# Patient Record
Sex: Female | Born: 1970 | Race: White | Hispanic: No | Marital: Married | Smoking: Never smoker
Health system: Southern US, Community
[De-identification: ages and names within clinical notes are randomized; demographics above are authoritative.]

## PROBLEM LIST (undated history)

## (undated) DIAGNOSIS — J302 Other seasonal allergic rhinitis: Secondary | ICD-10-CM

## (undated) DIAGNOSIS — J45909 Unspecified asthma, uncomplicated: Secondary | ICD-10-CM

## (undated) DIAGNOSIS — I639 Cerebral infarction, unspecified: Secondary | ICD-10-CM

## (undated) HISTORY — DX: Cerebral infarction, unspecified: I63.9

## (undated) HISTORY — DX: Unspecified asthma, uncomplicated: J45.909

## (undated) HISTORY — DX: Other seasonal allergic rhinitis: J30.2

---
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
## 2011-03-19 HISTORY — PX: ANTERIOR CRUCIATE LIGAMENT REPAIR: SHX115

## 2014-12-26 ENCOUNTER — Other Ambulatory Visit: Payer: Self-pay

## 2014-12-26 ENCOUNTER — Other Ambulatory Visit (HOSPITAL_COMMUNITY)
Admission: RE | Admit: 2014-12-26 | Discharge: 2014-12-26 | Disposition: A | Discharge status: Home / Self Care | Payer: 59 | Source: Ambulatory Visit | Attending: Obstetrics and Gynecology | Admitting: Obstetrics and Gynecology

## 2014-12-26 DIAGNOSIS — Z1151 Encounter for screening for human papillomavirus (HPV): Secondary | ICD-10-CM | POA: Insufficient documentation

## 2014-12-26 DIAGNOSIS — Z01419 Encounter for gynecological examination (general) (routine) without abnormal findings: Secondary | ICD-10-CM | POA: Diagnosis present

## 2014-12-26 DIAGNOSIS — Z1231 Encounter for screening mammogram for malignant neoplasm of breast: Secondary | ICD-10-CM

## 2015-01-06 ENCOUNTER — Ambulatory Visit: Payer: Self-pay

## 2015-01-23 ENCOUNTER — Ambulatory Visit
Admission: RE | Admit: 2015-01-23 | Discharge: 2015-01-23 | Disposition: A | Discharge status: Home / Self Care | Payer: 59 | Source: Ambulatory Visit

## 2015-01-23 DIAGNOSIS — Z1231 Encounter for screening mammogram for malignant neoplasm of breast: Secondary | ICD-10-CM

## 2015-12-28 ENCOUNTER — Other Ambulatory Visit: Payer: Self-pay | Admitting: Obstetrics and Gynecology

## 2015-12-28 DIAGNOSIS — Z1231 Encounter for screening mammogram for malignant neoplasm of breast: Secondary | ICD-10-CM

## 2016-01-01 ENCOUNTER — Encounter: Payer: Self-pay | Admitting: Family Medicine

## 2016-01-01 ENCOUNTER — Ambulatory Visit (INDEPENDENT_AMBULATORY_CARE_PROVIDER_SITE_OTHER): Payer: 59 | Admitting: Family Medicine

## 2016-01-01 VITALS — BP 112/82 | HR 77 | Temp 98.3°F | Wt 151.8 lb

## 2016-01-01 DIAGNOSIS — L729 Follicular cyst of the skin and subcutaneous tissue, unspecified: Secondary | ICD-10-CM

## 2016-01-01 DIAGNOSIS — Z131 Encounter for screening for diabetes mellitus: Secondary | ICD-10-CM

## 2016-01-01 DIAGNOSIS — Z1329 Encounter for screening for other suspected endocrine disorder: Secondary | ICD-10-CM

## 2016-01-01 DIAGNOSIS — Z130 Encounter for screening for diseases of the blood and blood-forming organs and certain disorders involving the immune mechanism: Secondary | ICD-10-CM | POA: Diagnosis not present

## 2016-01-01 DIAGNOSIS — F329 Major depressive disorder, single episode, unspecified: Secondary | ICD-10-CM | POA: Diagnosis not present

## 2016-01-01 DIAGNOSIS — Z13220 Encounter for screening for lipoid disorders: Secondary | ICD-10-CM

## 2016-01-01 LAB — CBC
HEMATOCRIT: 42.7 % (ref 36.0–46.0)
Hemoglobin: 14.2 g/dL (ref 12.0–15.0)
MCHC: 33.3 g/dL (ref 30.0–36.0)
MCV: 90.7 fl (ref 78.0–100.0)
Platelets: 246 10*3/uL (ref 150.0–400.0)
RBC: 4.71 Mil/uL (ref 3.87–5.11)
RDW: 13.2 % (ref 11.5–15.5)
WBC: 4.8 10*3/uL (ref 4.0–10.5)

## 2016-01-01 LAB — LIPID PANEL
CHOL/HDL RATIO: 2
Cholesterol: 187 mg/dL (ref 0–200)
HDL: 80 mg/dL (ref >39.00)
LDL Cholesterol: 92 mg/dL (ref 0–99)
NONHDL: 107.46
Triglycerides: 77 mg/dL (ref 0.0–149.0)
VLDL: 15.4 mg/dL (ref 0.0–40.0)

## 2016-01-01 LAB — COMPREHENSIVE METABOLIC PANEL
ALBUMIN: 4.7 g/dL (ref 3.5–5.2)
ALK PHOS: 32 U/L — AB (ref 39–117)
ALT: 11 U/L (ref 0–35)
AST: 18 U/L (ref 0–37)
BUN: 12 mg/dL (ref 6–23)
CHLORIDE: 102 meq/L (ref 96–112)
CO2: 29 mEq/L (ref 19–32)
CREATININE: 0.92 mg/dL (ref 0.40–1.20)
Calcium: 9.4 mg/dL (ref 8.4–10.5)
GFR: 70.64 mL/min (ref >60.00)
GLUCOSE: 81 mg/dL (ref 70–99)
POTASSIUM: 4.4 meq/L (ref 3.5–5.1)
SODIUM: 137 meq/L (ref 135–145)
TOTAL PROTEIN: 7.7 g/dL (ref 6.0–8.3)
Total Bilirubin: 0.5 mg/dL (ref 0.2–1.2)

## 2016-01-01 LAB — HEMOGLOBIN A1C: HEMOGLOBIN A1C: 5.3 % (ref 4.6–6.5)

## 2016-01-01 LAB — TSH: TSH: 2.38 u[IU]/mL (ref 0.35–4.50)

## 2016-01-01 MED ORDER — FLUOXETINE HCL 20 MG PO TABS: 20.0000 mg | ORAL_TABLET | Freq: Every day | ORAL | 5 refills | Status: DC

## 2016-01-01 MED ORDER — CEPHALEXIN 500 MG PO CAPS: 500.0000 mg | ORAL_CAPSULE | Freq: Two times a day (BID) | ORAL | 0 refills | Status: DC

## 2016-01-01 NOTE — Patient Instructions (Signed)
It was very nice to meet you today!  We will check your blood count, thyroid, cholesterol today and I will be in touch with your results Try the antibiotic for one week to see if we can get rid of the cyst in your nose.  Please let me know if this persists  We will start you on prozac 20 mg for depression.   Take 1 pill, then increase to 2 pills after 2-3 weeks.   Please come and see me in about 2 months to check on your progress- sooner if you are getting worse or do not notice any improvement.

## 2016-01-01 NOTE — Progress Notes (Signed)
[facility] [address], [address] [place], [place] [address] [phone] [phone]  Date:  01/01/2016   Name:  Laveda AbbeMaria D Lennartz   DOB:  08/13/1970   MRN:  [id number]  PCP:  Abbe AmsterdamOPLAND,JESSICA, MD    Chief Complaint: Establish Care (Pt here to est care. Would like to discuss )   History of Present Illness:  Laveda AbbeMaria D Grewell is a 45 y.o. very pleasant female patient who presents with the following:  She is here today as a new patient.  She has moved around a good bit, has been in this area for 3 years or so She has generally been very healthy, but wanted to come and see  She has 3 boys- ages 6712, 810 and 398.  She does not work outside the home right now.  She also helps to care for her mother who is ill with Parkinson's disease and a stroke. She is also very involved with the PTA.    She does have mild asthma- she uses her inhaler as needed.,  She needs it more in certain seasons.  She may need it for EIA more in the colder months.    She has otherwise generally in good health She sees Dr. Richardson Doppole for her OBG care mammo is scheduled for   Last tetanus in 2014 while she was living in [place]  She feels like she began to have some depression sx when they moved, she became pregnant and her mother got Parkinson's disease all at the same time.  Then in 2013 they had to move again, and then again a couple of years ago.  She thinks that this stress and her mom's stroke over the summer.   Her father is 43 yo, they are still married and live together  She tries to handle her sadness by exercise, spending time with friends, shopping  She has never been a "big medicine person" but thinks that she might need medication for depression  She can feel overwhelmed, and sometimes does not feel like getting out.  She will sometimes be tearful. She is sleeping pretty well but can wake up feeling anxious in the night.   Appetite is ok.    No SI.  She has never  been treated for depression.  Looking back she thinks that she has been depressed on and off for years.    Her LMP was 10/1//17  She declines a flu shot  There are no active problems to display for this patient.   No past medical history on file.  No past surgical history on file.  Social History  Substance Use Topics  . Smoking status: Not on file  . Smokeless tobacco: Not on file  . Alcohol use Not on file    No family history on file.  Allergies not on file  Medication list has been reviewed and updated.  No current outpatient prescriptions on file prior to visit.   No current facility-administered medications on file prior to visit.     Review of Systems:  As per HPI- otherwise negative. No fever, chills, nausea, vomiting, CP or SOB  Physical Examination: Vitals:   01/01/16 0949  BP: 112/82  Pulse: 77  Temp: 98.3 F (36.8 C)   Vitals:   01/01/16 0949  Weight: 151 lb 12.8 oz (68.9 kg)   There is no height or weight on file to calculate BMI. Ideal Body Weight:    GEN: WDWN, NAD, Non-toxic,  A & O x 3, looks well, slightly tearful when discussing her mom's illness HEENT: Atraumatic, Normocephalic. Neck supple. No masses, No LAD.  Bilateral TM wnl, oropharynx normal.  PEERL,EOMI.   She has noted a tender "cyst" in her right nostril that has come and gone for a few months. It will sometimes swell up and be tender like a pimple.   Ears and Nose: No external deformity. CV: RRR, No M/G/R. No JVD. No thrill. No extra heart sounds. PULM: CTA B, no wheezes, crackles, rhonchi. No retractions. No resp. distress. No accessory muscle use. ABD: S, NT, ND EXTR: No c/c/e NEURO Normal gait.  PSYCH: Normally interactive. Conversant. Not depressed or anxious appearing.  Calm demeanor.    Assessment and Plan: Reactive depression - Plan: FLUoxetine (PROZAC) 20 MG tablet  Screening for hyperlipidemia - Plan: Lipid panel  Screening for deficiency anemia - Plan:  CBC  Screening for diabetes mellitus - Plan: Comprehensive metabolic panel, Hemoglobin A1C  Screening for thyroid disorder - Plan: TSH  Subcutaneous cyst - Plan: cephALEXin (KEFLEX) 500 MG capsule  Start on prozac 20 mg for depression, increase to 40 mg after 2-3 weeks.  Warned about serotonin syndrome and possible SI in pt's on SSRI treatment.  Plan to recheck here in 2 months Will try a week of keflex for the "cyst" in her nostril  Signed Abbe Amsterdam, MD

## 2016-01-01 NOTE — Progress Notes (Signed)
Pre visit review using our clinic review tool, if applicable. No additional management support is needed unless otherwise documented below in the visit note. 

## 2016-01-17 ENCOUNTER — Encounter: Payer: Self-pay | Admitting: Family Medicine

## 2016-01-17 MED ORDER — ALBUTEROL SULFATE HFA 108 (90 BASE) MCG/ACT IN AERS: 1.0000 | INHALATION_SPRAY | Freq: Four times a day (QID) | RESPIRATORY_TRACT | 6 refills | Status: DC | PRN

## 2016-01-24 ENCOUNTER — Ambulatory Visit
Admission: RE | Admit: 2016-01-24 | Discharge: 2016-01-24 | Disposition: A | Discharge status: Home / Self Care | Payer: 59 | Source: Ambulatory Visit | Attending: Obstetrics and Gynecology | Admitting: Obstetrics and Gynecology

## 2016-01-24 DIAGNOSIS — Z1231 Encounter for screening mammogram for malignant neoplasm of breast: Secondary | ICD-10-CM

## 2016-03-04 ENCOUNTER — Ambulatory Visit: Payer: 59 | Admitting: Family Medicine

## 2016-03-06 ENCOUNTER — Observation Stay (HOSPITAL_COMMUNITY): Payer: 59

## 2016-03-06 ENCOUNTER — Ambulatory Visit: Payer: Self-pay | Admitting: Family Medicine

## 2016-03-06 ENCOUNTER — Telehealth: Payer: Self-pay | Admitting: Family Medicine

## 2016-03-06 ENCOUNTER — Ambulatory Visit: Payer: Self-pay | Admitting: Family

## 2016-03-06 ENCOUNTER — Encounter (HOSPITAL_COMMUNITY): Payer: Self-pay | Admitting: Vascular Surgery

## 2016-03-06 ENCOUNTER — Ambulatory Visit (INDEPENDENT_AMBULATORY_CARE_PROVIDER_SITE_OTHER): Payer: 59 | Admitting: Family Medicine

## 2016-03-06 ENCOUNTER — Observation Stay (HOSPITAL_COMMUNITY)
Admission: EM | Admit: 2016-03-06 | Discharge: 2016-03-08 | Disposition: A | Discharge status: Home / Self Care | Payer: 59 | Attending: Internal Medicine | Admitting: Internal Medicine

## 2016-03-06 VITALS — BP 120/84 | HR 53 | Temp 98.0°F | Ht 67.0 in | Wt 149.0 lb

## 2016-03-06 DIAGNOSIS — I7774 Dissection of vertebral artery: Secondary | ICD-10-CM | POA: Diagnosis not present

## 2016-03-06 DIAGNOSIS — G459 Transient cerebral ischemic attack, unspecified: Secondary | ICD-10-CM | POA: Diagnosis not present

## 2016-03-06 DIAGNOSIS — I7771 Dissection of carotid artery: Secondary | ICD-10-CM | POA: Diagnosis present

## 2016-03-06 DIAGNOSIS — Z823 Family history of stroke: Secondary | ICD-10-CM | POA: Insufficient documentation

## 2016-03-06 DIAGNOSIS — R2990 Unspecified symptoms and signs involving the nervous system: Secondary | ICD-10-CM

## 2016-03-06 DIAGNOSIS — Q2112 Patent foramen ovale: Secondary | ICD-10-CM

## 2016-03-06 DIAGNOSIS — Q211 Atrial septal defect: Secondary | ICD-10-CM | POA: Diagnosis not present

## 2016-03-06 DIAGNOSIS — J4520 Mild intermittent asthma, uncomplicated: Secondary | ICD-10-CM

## 2016-03-06 DIAGNOSIS — J45909 Unspecified asthma, uncomplicated: Secondary | ICD-10-CM | POA: Diagnosis not present

## 2016-03-06 LAB — URINALYSIS, ROUTINE W REFLEX MICROSCOPIC
Bilirubin Urine: NEGATIVE
GLUCOSE, UA: NEGATIVE mg/dL
Hgb urine dipstick: NEGATIVE
Ketones, ur: 5 mg/dL — AB
Nitrite: NEGATIVE
PH: 6 (ref 5.0–8.0)
Protein, ur: NEGATIVE mg/dL
Specific Gravity, Urine: 1.002 — ABNORMAL LOW (ref 1.005–1.030)

## 2016-03-06 LAB — DIFFERENTIAL
Basophils Absolute: 0 10*3/uL (ref 0.0–0.1)
Basophils Relative: 0 %
EOS ABS: 0.2 10*3/uL (ref 0.0–0.7)
EOS PCT: 3 %
LYMPHS PCT: 32 %
Lymphs Abs: 1.9 10*3/uL (ref 0.7–4.0)
MONO ABS: 0.5 10*3/uL (ref 0.1–1.0)
Monocytes Relative: 9 %
Neutro Abs: 3.3 10*3/uL (ref 1.7–7.7)
Neutrophils Relative %: 56 %

## 2016-03-06 LAB — CBC
HCT: 41.4 % (ref 36.0–46.0)
Hemoglobin: 13.5 g/dL (ref 12.0–15.0)
MCH: 29.9 pg (ref 26.0–34.0)
MCHC: 32.6 g/dL (ref 30.0–36.0)
MCV: 91.6 fL (ref 78.0–100.0)
PLATELETS: 221 10*3/uL (ref 150–400)
RBC: 4.52 MIL/uL (ref 3.87–5.11)
RDW: 13.3 % (ref 11.5–15.5)
WBC: 5.9 10*3/uL (ref 4.0–10.5)

## 2016-03-06 LAB — COMPREHENSIVE METABOLIC PANEL
ALK PHOS: 36 U/L — AB (ref 38–126)
ALT: 13 U/L — ABNORMAL LOW (ref 14–54)
ANION GAP: 8 (ref 5–15)
AST: 21 U/L (ref 15–41)
Albumin: 4.3 g/dL (ref 3.5–5.0)
BILIRUBIN TOTAL: 0.6 mg/dL (ref 0.3–1.2)
BUN: 11 mg/dL (ref 6–20)
CALCIUM: 9.3 mg/dL (ref 8.9–10.3)
CO2: 25 mmol/L (ref 22–32)
Chloride: 104 mmol/L (ref 101–111)
Creatinine, Ser: 0.86 mg/dL (ref 0.44–1.00)
GFR calc non Af Amer: >60 mL/min (ref >60)
Glucose, Bld: 91 mg/dL (ref 65–99)
Potassium: 3.9 mmol/L (ref 3.5–5.1)
SODIUM: 137 mmol/L (ref 135–145)
TOTAL PROTEIN: 7.1 g/dL (ref 6.5–8.1)

## 2016-03-06 LAB — TROPONIN I: Troponin I: <0.03 ng/mL (ref <0.03)

## 2016-03-06 LAB — PROTIME-INR
INR: 0.94
PROTHROMBIN TIME: 12.6 s (ref 11.4–15.2)

## 2016-03-06 LAB — APTT: aPTT: 33 seconds (ref 24–36)

## 2016-03-06 LAB — I-STAT TROPONIN, ED: Troponin i, poc: 0 ng/mL (ref 0.00–0.08)

## 2016-03-06 MED ORDER — ACETAMINOPHEN 160 MG/5ML PO SOLN: 650.0000 mg | ORAL | Status: DC | PRN

## 2016-03-06 MED ORDER — ALBUTEROL SULFATE (2.5 MG/3ML) 0.083% IN NEBU: 3.0000 mL | INHALATION_SOLUTION | Freq: Four times a day (QID) | RESPIRATORY_TRACT | Status: DC | PRN

## 2016-03-06 MED ORDER — SENNOSIDES-DOCUSATE SODIUM 8.6-50 MG PO TABS: 1.0000 | ORAL_TABLET | Freq: Every evening | ORAL | Status: DC | PRN

## 2016-03-06 MED ORDER — SODIUM CHLORIDE 0.9 % IV SOLN
INTRAVENOUS | Status: DC
  Administered 2016-03-07: 1 mL via INTRAVENOUS

## 2016-03-06 MED ORDER — ACETAMINOPHEN 650 MG RE SUPP: 650.0000 mg | RECTAL | Status: DC | PRN

## 2016-03-06 MED ORDER — IOPAMIDOL (ISOVUE-370) INJECTION 76%
INTRAVENOUS | Status: AC
  Administered 2016-03-06: 50 mL
  Filled 2016-03-06: qty 50

## 2016-03-06 MED ORDER — ACETAMINOPHEN 325 MG PO TABS
650.0000 mg | ORAL_TABLET | ORAL | Status: DC | PRN
  Administered 2016-03-07: 650 mg via ORAL
  Filled 2016-03-06: qty 2

## 2016-03-06 MED ORDER — HEPARIN (PORCINE) IN NACL 100-0.45 UNIT/ML-% IJ SOLN
900.0000 [IU]/h | INTRAMUSCULAR | Status: DC
  Administered 2016-03-07: 800 [IU]/h via INTRAVENOUS
  Filled 2016-03-06 (×2): qty 250

## 2016-03-06 MED ORDER — STROKE: EARLY STAGES OF RECOVERY BOOK
Freq: Once | Status: AC
  Administered 2016-03-07: 01:00:00

## 2016-03-06 NOTE — Telephone Encounter (Signed)
Caller name: Byrd HesselbachMaria Relationship to patient: self Can be reached: [phone]  Reason for call: pt called in stating yesterday she had slurred speech lasting only a few seconds to a minute. She had left arm numbness that lasted 1/2 the day. Transferred to Sarah with [facility].

## 2016-03-06 NOTE — Progress Notes (Signed)
ANTICOAGULATION CONSULT NOTE - Initial Consult  Pharmacy Consult for Heparin Indication: carotid dissection  No Known Allergies  Patient Measurements: Height: 5\' 7"  (170.2 cm) Weight: 149 lb (67.6 kg) IBW/kg (Calculated) : 61.6 Heparin Dosing Weight: 67.6 kg  Vital Signs: Temp: 97.7 F (36.5 C) (12/20 1656) Temp Source: Oral (12/20 1656) BP: 108/67 (12/20 2030) Pulse Rate: 66 (12/20 2030)  Labs:  Recent Labs  03/06/16 1503  HGB 13.5  HCT 41.4  PLT 221  APTT 33  LABPROT 12.6  INR 0.94  CREATININE 0.86    Estimated Creatinine Clearance: 82 mL/min (by C-G formula based on SCr of 0.86 mg/dL).   Medical History: Past Medical History:  Diagnosis Date  . Asthma   . Seasonal allergies     Medications:   (Not in a hospital admission) Scheduled:  Infusions:   Assessment: 45yo female history of asthma presents with aphasia and left arm weakness. Pharmacy is consulted to dose heparin for carotid dissection with no bolus and low goal.   Goal of Therapy:  Heparin level 0.3-0.5 units/ml Monitor platelets by anticoagulation protocol: Yes   Plan:  Start heparin infusion at 800 units/hr Check anti-Xa level in 8 hours and daily while on heparin Continue to monitor H&H and platelets  Arlean Hoppingorey M. Newman PiesBall, PharmD, BCPS Clinical Pharmacist Pager [phone] 03/06/2016,8:56 PM

## 2016-03-06 NOTE — Telephone Encounter (Signed)
Patient Name: Janice EllisMARIA Myers  DOB: 07/06/1970    Initial Comment Caller states she had an episode yesterday where her left arm went weak and she had a few seconds of slurred speech. The symptoms got better as the day went on, but she isn't having symptoms now.    Nurse Assessment  Nurse: Sherilyn CooterHenry, RN, Thurmond ButtsWade Date/Time Lamount Cohen(Eastern Time): 03/06/2016 8:32:53 AM  Confirm and document reason for call. If symptomatic, describe symptoms. ---Caller states that she had weakness in her left arm yesterday which lasted about 2 hours. When she held her arms up in front of her, her left wrist would drop. She had some slurring of her speech, but it only last a few seconds. She denies symptoms at this time. She has a tightness in her left neck which she states feels like a pinched nerve. She denies pain. She states that she feels she needs a good massage.  Does the patient have any new or worsening symptoms? ---Yes  Will a triage be completed? ---Yes  Related visit to physician within the last 2 weeks? ---No  Does the PT have any chronic conditions? (i.e. diabetes, asthma, etc.) ---Yes  List chronic conditions. ---Slight Asthma/Allergies  Is the patient pregnant or possibly pregnant? (Ask all females between the ages of 1112-55) ---No  Is this a behavioral health or substance abuse call? ---No     Guidelines    Guideline Title Affirmed Question Affirmed Notes  Neurologic Deficit [1] Weakness (i.e., paralysis, loss of muscle strength) of the face, arm / hand, or leg / foot on one side of the body AND [2] sudden onset AND [3] brief (now gone)    Final Disposition User   Go to ED Now (or PCP triage) Sherilyn CooterHenry, RN, Thurmond ButtsWade    Comments  No appointments available with Dr. Dallas Schimkeopeland. Only one with one available close to the recommended time frame was NP Sandford CrazeMelissa O'Sullivan at 1:15pm. I checked [facility] for an earlier one and none were available. She took the one with NP Peggyann Juba'Sullivan.   Referrals  REFERRED TO PCP OFFICE    Disagree/Comply: Comply

## 2016-03-06 NOTE — ED Provider Notes (Signed)
[facility]EM[facility]ENCY DEPT Provider Note   CSN: [id number] Arrival date & time: 03/06/16  1455     History   Chief Complaint Chief Complaint  Patient presents with  . Aphasia    HPI Maheen Cwikla is a 45 y.o. female.  Patient is a 45 year old female with history of asthma but no other medical problems who presents for evaluation of possible TIA. Initially morning around 7:30 patient had 1 minute of slurred speech followed by 5 hours of left arm weakness. Symptoms completely resolved by noon yesterday. Patient has not had any further symptoms since.    The history is provided by the patient.  Neurologic Problem  This is a new problem. The current episode started yesterday. The problem has been resolved. Pertinent negatives include no chest pain, no abdominal pain, no headaches and no shortness of breath.    Past Medical History:  Diagnosis Date  . Asthma   . Seasonal allergies     Patient Active Problem List   Diagnosis Date Noted  . TIA (transient ischemic attack) 03/06/2016  . Asthma 03/06/2016  . Reactive depression 01/01/2016    Past Surgical History:  Procedure Laterality Date  . ANTERIOR CRUCIATE LIGAMENT REPAIR  2013  . KELOID EXCISION  1992    OB History    No data available       Home Medications    Prior to Admission medications   Medication Sig Start Date End Date Taking? Authorizing Provider  albuterol (PROVENTIL HFA;VENTOLIN HFA) 108 (90 Base) MCG/ACT inhaler Inhale 1 puff into the lungs every 6 (six) hours as needed for wheezing or shortness of breath. 01/17/16  Yes Pearline Cables, MD    Family History Family History  Problem Relation Age of Onset  . Stroke Mother   . Parkinson's disease Mother   . Hyperlipidemia Father   . Alzheimer's disease Maternal Grandmother   . Prostate cancer Maternal Grandfather   . Heart disease Paternal Grandfather   . Epilepsy Other     Uncle    Social History Social History  Substance Use Topics  .  Smoking status: Never Smoker  . Smokeless tobacco: Never Used  . Alcohol use Yes     Comment: Seldom     Allergies   Patient has no known allergies.   Review of Systems Review of Systems  Constitutional: Negative for chills and fever.  HENT: Negative for ear pain.   Eyes: Negative for pain and visual disturbance.  Respiratory: Negative for cough and shortness of breath.   Cardiovascular: Negative for chest pain and palpitations.  Gastrointestinal: Negative for abdominal pain, nausea and vomiting.  Genitourinary: Negative for dysuria and hematuria.  Musculoskeletal: Negative for neck pain.  Skin: Negative for rash.  Neurological: Positive for speech difficulty and weakness. Negative for dizziness, syncope, facial asymmetry, numbness and headaches.  Psychiatric/Behavioral: Negative for confusion.  All other systems reviewed and are negative.    Physical Exam Updated Vital Signs BP 128/84   Pulse (!) 55   Temp 97.7 F (36.5 C) (Oral)   Resp 16   Ht 5\' 7"  (1.702 m)   Wt 67.6 kg   LMP 02/16/2016   SpO2 98%   BMI 23.34 kg/m   Physical Exam  Constitutional: She appears well-developed and well-nourished. No distress.  HENT:  Head: Normocephalic and atraumatic.  Mouth/Throat: Oropharynx is clear and moist.  Eyes: Conjunctivae and EOM are normal. Pupils are equal, round, and reactive to light.  Neck: Neck supple.  Cardiovascular: Normal rate and  regular rhythm.   No murmur heard. Pulmonary/Chest: Effort normal and breath sounds normal. No respiratory distress.  Abdominal: Soft. There is no tenderness.  Musculoskeletal: She exhibits no edema.  Neurological: She is alert. She has normal strength. She is not disoriented. No cranial nerve deficit or sensory deficit. She displays a negative Romberg sign. GCS eye subscore is 4. GCS verbal subscore is 5. GCS motor subscore is 6.  5/5 strength all 4 ext  Skin: Skin is warm and dry.  Psychiatric: She has a normal mood and  affect.  Nursing note and vitals reviewed.    ED Treatments / Results  Labs (all labs ordered are listed, but only abnormal results are displayed) Labs Reviewed  COMPREHENSIVE METABOLIC PANEL - Abnormal; Notable for the following:       Result Value   ALT 13 (*)    Alkaline Phosphatase 36 (*)    All other components within normal limits  URINALYSIS, ROUTINE W REFLEX MICROSCOPIC - Abnormal; Notable for the following:    Color, Urine STRAW (*)    Specific Gravity, Urine 1.002 (*)    Ketones, ur 5 (*)    Leukocytes, UA MODERATE (*)    Bacteria, UA RARE (*)    Squamous Epithelial / LPF 0-5 (*)    All other components within normal limits  PROTIME-INR  APTT  CBC  DIFFERENTIAL  I-STAT TROPOININ, ED    EKG  EKG Interpretation None       Radiology Ct Angio Head W Or Wo Contrast  Result Date: 03/06/2016 CLINICAL DATA:  Slurred speech for 1 minutes, LEFT arm weakness for 2 hours. Assess transient ischemic attack. EXAM: CT ANGIOGRAPHY HEAD AND NECK TECHNIQUE: Multidetector CT imaging of the head and neck was performed using the standard protocol during bolus administration of intravenous contrast. Multiplanar CT image reconstructions and MIPs were obtained to evaluate the vascular anatomy. Carotid stenosis measurements (when applicable) are obtained utilizing NASCET criteria, using the distal internal carotid diameter as the denominator. CONTRAST:  50 cc Isovue 370 COMPARISON:  None. FINDINGS: CT HEAD FINDINGS BRAIN: The ventricles and sulci are normal. No intraparenchymal hemorrhage, mass effect nor midline shift. No acute large vascular territory infarcts. No abnormal extra-axial fluid collections. Basal cisterns are patent. VASCULAR: Unremarkable. SKULL/SOFT TISSUES: No skull fracture. No significant soft tissue swelling. ORBITS/SINUSES: The included ocular globes and orbital contents are normal.The mastoid aircells and included paranasal sinuses are well-aerated. OTHER: None. CTA  NECK AORTIC ARCH: Normal appearance of the thoracic arch, normal branch pattern. The origins of the innominate, left Common carotid artery and subclavian artery are widely patent. RIGHT CAROTID SYSTEM: Common carotid artery is widely patent, coursing in a straight line fashion. Normal appearance of the carotid bifurcation without hemodynamically significant stenosis by NASCET criteria. Normal appearance of the included internal carotid artery. LEFT CAROTID SYSTEM: Common carotid artery is widely patent, coursing in a straight line fashion. Normal appearance of the carotid bifurcation without hemodynamically significant stenosis by NASCET criteria. Normal appearance of the included internal carotid artery. VERTEBRAL ARTERIES:Codominant vertebral artery's. 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 No intimal hematoma. No flow limiting stenosis. SKELETON: No acute osseous process though bone windows have not been submitted. Mild degenerative change of the cervical spine without significant neural foraminal narrowing. OTHER NECK: Soft tissues of the neck are non-acute though, not tailored for evaluation. Effaced LEFT piriform sinus, normal variant. CTA HEAD ANTERIOR CIRCULATION: Normal appearance of the cervical internal carotid arteries, petrous,  cavernous and supra clinoid internal carotid arteries. Widely patent anterior communicating artery. Normal appearance of the anterior and middle cerebral arteries. No large vessel occlusion, hemodynamically significant stenosis, dissection, luminal irregularity, contrast extravasation or aneurysm. POSTERIOR CIRCULATION: Normal appearance of the vertebral arteries, vertebrobasilar junction and basilar artery, as well as main branch vessels. Normal appearance of the posterior cerebral arteries. No large vessel occlusion, hemodynamically significant stenosis, dissection, luminal irregularity, contrast extravasation or aneurysm. VENOUS  SINUSES: Major dural venous sinuses are patent though not tailored for evaluation on this angiographic examination. ANATOMIC VARIANTS: None. DELAYED PHASE: No abnormal intracranial enhancement. IMPRESSION: CT HEAD:  Negative. 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 No hemodynamically significant stenosis. CTA HEAD:  Negative. Electronically Signed   By: Awilda Metroourtnay  Bloomer M.D.   On: 03/06/2016 20:04   Ct Angio Neck W And/or Wo Contrast  Result Date: 03/06/2016 CLINICAL DATA:  Slurred speech for 1 minutes, LEFT arm weakness for 2 hours. Assess transient ischemic attack. EXAM: CT ANGIOGRAPHY HEAD AND NECK TECHNIQUE: Multidetector CT imaging of the head and neck was performed using the standard protocol during bolus administration of intravenous contrast. Multiplanar CT image reconstructions and MIPs were obtained to evaluate the vascular anatomy. Carotid stenosis measurements (when applicable) are obtained utilizing NASCET criteria, using the distal internal carotid diameter as the denominator. CONTRAST:  50 cc Isovue 370 COMPARISON:  None. FINDINGS: CT HEAD FINDINGS BRAIN: The ventricles and sulci are normal. No intraparenchymal hemorrhage, mass effect nor midline shift. No acute large vascular territory infarcts. No abnormal extra-axial fluid collections. Basal cisterns are patent. VASCULAR: Unremarkable. SKULL/SOFT TISSUES: No skull fracture. No significant soft tissue swelling. ORBITS/SINUSES: The included ocular globes and orbital contents are normal.The mastoid aircells and included paranasal sinuses are well-aerated. OTHER: None. CTA NECK AORTIC ARCH: Normal appearance of the thoracic arch, normal branch pattern. The origins of the innominate, left Common carotid artery and subclavian artery are widely patent. RIGHT CAROTID SYSTEM: Common carotid artery is widely patent, coursing in a straight line fashion. Normal appearance of the carotid bifurcation without  hemodynamically significant stenosis by NASCET criteria. Normal appearance of the included internal carotid artery. LEFT CAROTID SYSTEM: Common carotid artery is widely patent, coursing in a straight line fashion. Normal appearance of the carotid bifurcation without hemodynamically significant stenosis by NASCET criteria. Normal appearance of the included internal carotid artery. VERTEBRAL ARTERIES:Codominant vertebral artery's. 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 No intimal hematoma. No flow limiting stenosis. SKELETON: No acute osseous process though bone windows have not been submitted. Mild degenerative change of the cervical spine without significant neural foraminal narrowing. OTHER NECK: Soft tissues of the neck are non-acute though, not tailored for evaluation. Effaced LEFT piriform sinus, normal variant. CTA HEAD ANTERIOR CIRCULATION: Normal appearance of the cervical internal carotid arteries, petrous, cavernous and supra clinoid internal carotid arteries. Widely patent anterior communicating artery. Normal appearance of the anterior and middle cerebral arteries. No large vessel occlusion, hemodynamically significant stenosis, dissection, luminal irregularity, contrast extravasation or aneurysm. POSTERIOR CIRCULATION: Normal appearance of the vertebral arteries, vertebrobasilar junction and basilar artery, as well as main branch vessels. Normal appearance of the posterior cerebral arteries. No large vessel occlusion, hemodynamically significant stenosis, dissection, luminal irregularity, contrast extravasation or aneurysm. VENOUS SINUSES: Major dural venous sinuses are patent though not tailored for evaluation on this angiographic examination. ANATOMIC VARIANTS: None. DELAYED PHASE: No abnormal intracranial enhancement. IMPRESSION: CT HEAD:  Negative. 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 No hemodynamically  significant stenosis. CTA HEAD:  Negative. Electronically Signed   By: Awilda Metro M.D.   On: 03/06/2016 20:04    Procedures Procedures (including critical care time)  Medications Ordered in ED Medications  iopamidol (ISOVUE-370) 76 % injection (50 mLs  Contrast Given 03/06/16 1901)     Initial Impression / Assessment and Plan / ED Course  I have reviewed the triage vital signs and the nursing notes.  Pertinent labs & imaging results that were available during my care of the patient were reviewed by me and considered in my medical decision making (see chart for details).  Clinical Course     Patient is a 45 year old female with history of asthma but otherwise healthy who presents for slurred speech and left arm weakness that has since resolved. Presentation concerning for TIA.  Patient had 1 minute of slurred speech yesterday morning followed by 5 hours of left arm weakness. Symptoms have all since resolved and is currently symptomatically. Patient has a normal neurological exam here. Next  Neurologic consult and further evaluation and recommended CTA head and neck. 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 This should not be the cause of her symptoms and may be a incidental finding. If needed neurosurgery consult can be obtained inpatient.  Patient will be admitted to medicine with neurology following for further management. Patient will obtain rest of TIA workup inpatient.   Patient seen with attending, Dr.Delo.  Final Clinical Impressions(s) / ED Diagnoses   Final diagnoses:  Transient cerebral ischemia, unspecified type    New Prescriptions New Prescriptions   No medications on file     Dwana Melena, DO 03/06/16 2059    Geoffery Lyons, MD 03/06/16 2328

## 2016-03-06 NOTE — Progress Notes (Signed)
[facility] [address], [address] [place], [place] [address] [phone] [phone]  Date:  03/06/2016   Name:  Janice EllisMaria Myers   DOB:  02/18/1971   MRN:  [id number]  PCP:  Abbe AmsterdamOPLAND,JESSICA, MD    Chief Complaint: No chief complaint on file.   History of Present Illness:  Janice EllisMaria Myers is a 45 y.o. very pleasant female patient who presents with the following:  I saw this pt once in October- we started her on prozac for depression.  Here today with complaint of a strange episode that occurred yesterday She feels that her health is generally excellent- she is very active and exercises regularly  Yesterday am she got up around 0600. Around 0730 she notes that her speech was garbled and she was aware that her voice did not sound right ie: she was not forming words properly.  A couple of minutes later her speech came back to normal. However she then noted that her left arm was not working normally- it felt very weak.  She tried to hold the arms out in front of her and noted that her left arm wanted to drop spontaneously.  She laid down for a couple of hours after driving her children to school.   The arm symptoms lasted for about 2 hours She did not check for any facial assmetry  She does feel like her left shoulder is a bit stiff "like you need a deep tissue massage" Never had any headache  Her mother has parkinson's disease, and her brother has epilepsy She herself has never had sx like this.   She did have iron def anemia in the past Today she feels tired ut otherwise normal.    No new medications We started her on prozac but she stopped taking it as she felt too tired.   She feels like her depression is now back under control and that she does not need the prozac at this time Patient Active Problem List   Diagnosis Date Noted  . 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 Asthma   . Seasonal  allergies     Past Surgical History:  Procedure Laterality Date  . ANTERIOR CRUCIATE LIGAMENT REPAIR  2013  . KELOID EXCISION  1992    Social History  Substance Use Topics  . Smoking status: Never Smoker  . Smokeless tobacco: Never Used  . Alcohol use Yes     Comment: Seldom    Family History  Problem Relation Age of Onset  . Stroke Mother   . Parkinson's disease Mother   . Hyperlipidemia Father   . Alzheimer's disease Maternal Grandmother   . Prostate cancer Maternal Grandfather   . Heart disease Paternal Grandfather   . Epilepsy Other     Uncle    Not on File  Medication list has been reviewed and updated.  Current Outpatient Prescriptions on File Prior to Visit  Medication Sig Dispense Refill  . albuterol (PROVENTIL HFA;VENTOLIN HFA) 108 (90 Base) MCG/ACT inhaler Inhale 1 puff into the lungs every 6 (six) hours as needed for wheezing or shortness of breath. 18 g 6  . cephALEXin (KEFLEX) 500 MG capsule Take 1 capsule (500 mg total) by mouth 2 (two) times daily. 14 capsule 0  . fluocinonide cream (LIDEX) 0.05 % Apply to area on left upper back twice a day  0  . FLUoxetine (PROZAC) 20 MG tablet Take 1 tablet (  20 mg total) by mouth daily. Increase to 2 pills after 2-3 weeks 60 tablet 5   No current facility-administered medications on file prior to visit.     Review of Systems:  As per HPI- otherwise negative. No CP, SOB, fever, chills, nausea, vomiting, diarrhea    Physical Examination: Vitals:   03/06/16 1323  BP: 120/84  Pulse: (!) 53  Temp: 98 F (36.7 C)    GEN: WDWN, NAD, Non-toxic, A & O x 3, looks well HEENT: Atraumatic, Normocephalic. Neck supple. No masses, No LAD.  Bilateral TM wnl, oropharynx normal.  PEERL,EOMI.   Ears and Nose: No external deformity. CV: RRR, No M/G/R. No JVD. No thrill. No extra heart sounds. PULM: CTA B, no wheezes, crackles, rhonchi. No retractions. No resp. distress. No accessory muscle use. ABD: S, NT, ND, +BS. No  rebound. No HSM. EXTR: No c/c/e NEURO Normal gait.   Normal strength, sensation, DTR of all extremities,  Negative rombeg.   PSYCH: Normally interactive. Conversant. Not depressed or anxious appearing.  Calm demeanor.    Assessment and Plan: Neurological symptoms  Generally healthy young woman who had sx suspicious for a TIA or CVA yesterday.  She is now back to normal except for tenderness in the left neck  Signed Abbe AmsterdamJessica Copland, MD

## 2016-03-06 NOTE — Consult Note (Signed)
Neurology Consultation Reason for Consult: Transient left arm weakness and slurred speech  Referring Physician: Judd Myers, D   CC: Left arm weakness   History is obtained from:Patient   HPI: Janice EllisMaria Myers is a 45 y.o. female who presents with transient left-arm weakness and slurred speech. She states that her child asked her a question, and when she responded it sounded "like really drunk person." This only lasted for approximately a minute, but she noticed that her left arm did not seem to be functioning properly. She describes it is heavy like it required more effort to do anything than she typically would need. This lasted longer, approximately 2 hours. She did not feel any numbness, just weakness.  She has been looking things up on the Internet and became concerned when she realized that this could be consistent with TIA and therefore sought care in the emergency department today.  She denies any drug use or tobacco use. She does not drink. She denies headache.  LKW: Yesterday morning  tpa given?: no, out of window, resolved symptoms     ROS: A 14 point ROS was performed and is negative except as noted in the HPI.   Past Medical History:  Diagnosis Date  . Asthma   . Seasonal allergies      Family History  Problem Relation Age of Onset  . Stroke Mother   . Parkinson's disease Mother   . Hyperlipidemia Father   . Alzheimer's disease Maternal Grandmother   . Prostate cancer Maternal Grandfather   . Heart disease Paternal Grandfather   . Epilepsy Other     Uncle     Social History:  reports that she has never smoked. She has never used smokeless tobacco. She reports that she drinks alcohol. She reports that she does not use drugs.   Exam: Current vital signs: BP 141/74   Pulse 66   Temp 97.7 F (36.5 C) (Oral)   Resp 17   Ht 5\' 7"  (1.702 m)   Wt 67.6 kg (149 lb)   LMP 02/16/2016   SpO2 98%   BMI 23.34 kg/m  Vital signs in last 24 hours: Temp:  [97.7 F (36.5  C)-98.1 F (36.7 C)] 97.7 F (36.5 C) (12/20 1656) Pulse Rate:  [53-66] 66 (12/20 1815) Resp:  [15-21] 17 (12/20 1815) BP: (117-164)/(74-97) 141/74 (12/20 1815) SpO2:  [69 %-100 %] 98 % (12/20 1815) Weight:  [67.6 kg (149 lb)] 67.6 kg (149 lb) (12/20 1503)   Physical Exam  Constitutional: Appears well-developed and well-nourished.  Psych: Affect appropriate to situation Eyes: No scleral injection HENT: No OP obstrucion Head: Normocephalic.  Cardiovascular: Normal rate and regular rhythm.  Respiratory: Effort normal and breath sounds normal to anterior ascultation GI: Soft.  No distension. There is no tenderness.  Skin: WDI  Neuro: Mental Status: Patient is awake, alert, oriented to person, place, month, year, and situation. Patient is able to give a clear and coherent history. No signs of aphasia or neglect Cranial Nerves: II: Visual Fields are full. Pupils are equal, round, and reactive to light.   III,IV, VI: EOMI without ptosis or diploplia.  V: Facial sensation is symmetric to temperature VII: Facial movement is symmetric.  VIII: hearing is intact to voice X: Uvula elevates symmetrically XI: Shoulder shrug is symmetric. XII: tongue is midline without atrophy or fasciculations.  Motor: Tone is normal. Bulk is normal. 5/5 strength was present in all four extremities.  Sensory: Sensation is symmetric to light touch and temperature in the arms  and legs. Cerebellar: FNF and HKS are intact bilaterally  I have reviewed labs in epic and the results pertinent to this consultation are: CMP-unremarkable, CBC normal  Impression: 45 year old female with transient dysarthria and left arm weakness. This is concerning for TIA, and though she does not have any known risk factors I do think that further evaluation is needed given the description.  Recommendations: 1. HgbA1c, fasting lipid panel 2. MRI of the brain without contrast 3. Frequent neuro checks 4. Echocardiogram 5.  CT angio head and neck.  6. Prophylactic therapy-Antiplatelet med: Aspirin - dose 325mg  PO or 300mg  PR 7. Risk factor modification 8. Telemetry monitoring 9. please page stroke NP  Or  PA  Or MD  M-F from 8am -4 pm starting 12/21 as this patient will be followed by the stroke team at this point.   You can look them up on [email or web address]     Janice Myers Janice Illescas, MD [facility] [phone]  If 7pm- 7am, please page neurology on call as listed in AMION.

## 2016-03-06 NOTE — ED Notes (Signed)
Admit Doctor reviewed CT results and will speak with neurologist.

## 2016-03-06 NOTE — Patient Instructions (Signed)
[facility]- I will call the intake nurse and let him/ her know what is going on.  Let them know at triage that you were told to come in for evaluation of possible stroke

## 2016-03-06 NOTE — ED Triage Notes (Signed)
Pt reports to the ED for eval of sudden onset of aphasia and left arm weakness that began yesterday. She reports she was talking to her sons and she noticed slurred speech and then afterwards her left arm became weak. She reports she laid down and took a nap and after 2 hours she woke up and still had the weakness. However, as the day progressed her symptoms had resolved. She reports that the only symptoms she has is a left shoulder stiffness and tightness.

## 2016-03-06 NOTE — ED Notes (Signed)
Admit Doctor wants to review CT results before patient is transported.

## 2016-03-06 NOTE — H&P (Signed)
Janice Myers JXB:[id number] DOB: May 20, 1970 DOA: 03/06/2016     PCP: Abbe Amsterdam, MD   Outpatient Specialists: none   Patient coming from:   home Lives  With family    Chief Complaint: aphasia  HPI: Janice Myers is a 45 y.o. female with medical history significant of asthma and depression    Presented with slurred and garbled speech that occurred at 7:30 AM yesterday on 19th of December states her words was not informed to properly lasting for a few seconds to a minute and left arm numbness and weakness, she couldn't make a fist.  she held her arms up and noticed that the left arm was drifting down that lasted about 2 hours  pateint reports symptoms got better during the day and currently has resolved  Denies any associated headache she felt a little tired but otherwise unremarkable she reports some shoulder tightness. She reports she exercises on the regular basis with weight lifting and high intensity routines.   She reports that few weeks ago she had needling done.   Regarding pertinent Chronic problems: She has known history of mild asthma uses inhaler only as needed and seasonal allergies. Has history of depression treated with Prozac   IN ER:  Temp (24hrs), Avg:97.9 F (36.6 C), Min:97.7 F (36.5 C), Max:98.1 F (36.7 C)     RR 17 98% RA HR 66 BP 141/74 Trop 0.00 INR 0.94 WBC 5.9 Hg 13.5  Na 137 cr 0.86  Following Medications were ordered in ER: Medications - No data to display   ER provider discussed case with: Neurology recommends admission to hospitalist will order CTA G over chest head and neck otherwise TIA workup in a.m.   Hospitalist was called for admission forTIA  Review of Systems:    Pertinent positives include:slurred speech,  tingling,  Weakness of Left arm  Constitutional:  No weight loss, night sweats, Fevers, chills, fatigue, weight loss  HEENT:  No headaches, Difficulty swallowing,Tooth/dental problems,Sore throat,  No sneezing,  itching, ear ache, nasal congestion, post nasal drip,  Cardio-vascular:  No chest pain, Orthopnea, PND, anasarca, dizziness, palpitations.no Bilateral lower extremity swelling  GI:  No heartburn, indigestion, abdominal pain, nausea, vomiting, diarrhea, change in bowel habits, loss of appetite, melena, blood in stool, hematemesis Resp:  no shortness of breath at rest. No dyspnea on exertion, No excess mucus, no productive cough, No non-productive cough, No coughing up of blood.No change in color of mucus.No wheezing. Skin:  no rash or lesions. No jaundice GU:  no dysuria, change in color of urine, no urgency or frequency. No straining to urinate.  No flank pain.  Musculoskeletal:  No joint pain or no joint swelling. No decreased range of motion. No back pain.  Psych:  No change in mood or affect. No depression or anxiety. No memory loss.  Neuro: , no double vision, no gait abnormality, no  no confusion  As per HPI otherwise 10 point review of systems negative.   Past Medical History: Past Medical History:  Diagnosis Date  . Asthma   . Seasonal allergies    Past Surgical History:  Procedure Laterality Date  . ANTERIOR CRUCIATE LIGAMENT REPAIR  2013  . KELOID EXCISION  1992     Social History:  Ambulatory   independently      reports that she has never smoked. She has never used smokeless tobacco. She reports that she drinks alcohol. She reports that she does not use drugs.  Allergies:  No Known Allergies  Family History:   Family History  Problem Relation Age of Onset  . Stroke Mother   . Parkinson's disease Mother   . Hyperlipidemia Father   . Alzheimer's disease Maternal Grandmother   . Prostate cancer Maternal Grandfather   . Heart disease Paternal Grandfather   . Epilepsy Other     Uncle    Medications: Prior to Admission medications   Medication Sig Start Date End Date Taking? Authorizing Provider  albuterol (PROVENTIL HFA;VENTOLIN HFA) 108 (90  Base) MCG/ACT inhaler Inhale 1 puff into the lungs every 6 (six) hours as needed for wheezing or shortness of breath. 01/17/16  Yes Pearline Cables, MD    Physical Exam: Patient Vitals for the past 24 hrs:  BP Temp Temp src Pulse Resp SpO2 Height Weight  03/06/16 1815 141/74 - - 66 17 98 % - -  03/06/16 1800 126/84 - - (!) 54 15 99 % - -  03/06/16 1730 118/85 - - (!) 56 16 98 % - -  03/06/16 1715 164/97 - - 63 21 100 % - -  03/06/16 1656 117/74 97.7 F (36.5 C) Oral (!) 58 17 98 % - -  03/06/16 1503 - - - - - - 5\' 7"  (1.702 m) 67.6 kg (149 lb)  03/06/16 1502 133/87 98.1 F (36.7 C) Oral 61 16 100 % - -    1. General:  in No Acute distress 2. Psychological: Alert and   Oriented 3. Head/ENT:     Dry Mucous Membranes                          Head Non traumatic, neck supple                          Normal   Dentition 4. SKIN:  decreased Skin turgor,  Skin clean Dry and intact no rash 5. Heart: Regular rate and rhythm no  Murmur, Rub or gallop 6. Lungs:  Clear to auscultation bilaterally, no wheezes or crackles   7. Abdomen: Soft,   non-tender, Non distended 8. Lower extremities: no clubbing, cyanosis, or edema 9. Neurologically   strength 5 out of 5 in all 4 extremities cranial nerves II through XII intact 10. MSK: Normal range of motion   body mass index is 23.34 kg/m.  Labs on Admission:   Labs on Admission: I have personally reviewed following labs and imaging studies  CBC:  Recent Labs Lab 03/06/16 1503  WBC 5.9  NEUTROABS 3.3  HGB 13.5  HCT 41.4  MCV 91.6  PLT 221   Basic Metabolic Panel:  Recent Labs Lab 03/06/16 1503  NA 137  K 3.9  CL 104  CO2 25  GLUCOSE 91  BUN 11  CREATININE 0.86  CALCIUM 9.3   GFR: Estimated Creatinine Clearance: 82 mL/min (by C-G formula based on SCr of 0.86 mg/dL). Liver Function Tests:  Recent Labs Lab 03/06/16 1503  AST 21  ALT 13*  ALKPHOS 36*  BILITOT 0.6  PROT 7.1  ALBUMIN 4.3   No results for input(s):  LIPASE, AMYLASE in the last 168 hours. No results for input(s): AMMONIA in the last 168 hours. Coagulation Profile:  Recent Labs Lab 03/06/16 1503  INR 0.94   Cardiac Enzymes: No results for input(s): CKTOTAL, CKMB, CKMBINDEX, TROPONINI in the last 168 hours. BNP (last 3 results) No results for input(s): PROBNP in the last 8760 hours. HbA1C: No results for input(s):  HGBA1C in the last 72 hours. CBG: No results for input(s): GLUCAP in the last 168 hours. Lipid Profile: No results for input(s): CHOL, HDL, LDLCALC, TRIG, CHOLHDL, LDLDIRECT in the last 72 hours. Thyroid Function Tests: No results for input(s): TSH, T4TOTAL, FREET4, T3FREE, THYROIDAB in the last 72 hours. Anemia Panel: No results for input(s): VITAMINB12, FOLATE, FERRITIN, TIBC, IRON, RETICCTPCT in the last 72 hours. Urine analysis: No results found for: COLORURINE, APPEARANCEUR, LABSPEC, PHURINE, GLUCOSEU, HGBUR, BILIRUBINUR, KETONESUR, PROTEINUR, UROBILINOGEN, NITRITE, LEUKOCYTESUR Sepsis Labs: @LABRCNTIP (procalcitonin:4,lacticidven:4) )No results found for this or any previous visit (from the past 240 hour(s)).    UA   ordered  Lab Results  Component Value Date   HGBA1C 5.3 01/01/2016    Estimated Creatinine Clearance: 82 mL/min (by C-G formula based on SCr of 0.86 mg/dL).  BNP (last 3 results) No results for input(s): PROBNP in the last 8760 hours.   ECG REPORT  Independently reviewed Rate: 60  Rhythm: NSR ST&T Change: No acute ischemic changes   QTC 408  Filed Weights   03/06/16 1503  Weight: 67.6 kg (149 lb)     Cultures: No results found for: SDES, SPECREQUEST, CULT, REPTSTATUS   Radiological Exams on Admission: Ct Angio Head W Or Wo Contrast  Result Date: 03/06/2016 CLINICAL DATA:  Slurred speech for 1 minutes, LEFT arm weakness for 2 hours. Assess transient ischemic attack. EXAM: CT ANGIOGRAPHY HEAD AND NECK TECHNIQUE: Multidetector CT imaging of the head and neck was performed using  the standard protocol during bolus administration of intravenous contrast. Multiplanar CT image reconstructions and MIPs were obtained to evaluate the vascular anatomy. Carotid stenosis measurements (when applicable) are obtained utilizing NASCET criteria, using the distal internal carotid diameter as the denominator. CONTRAST:  50 cc Isovue 370 COMPARISON:  None. FINDINGS: CT HEAD FINDINGS BRAIN: The ventricles and sulci are normal. No intraparenchymal hemorrhage, mass effect nor midline shift. No acute large vascular territory infarcts. No abnormal extra-axial fluid collections. Basal cisterns are patent. VASCULAR: Unremarkable. SKULL/SOFT TISSUES: No skull fracture. No significant soft tissue swelling. ORBITS/SINUSES: The included ocular globes and orbital contents are normal.The mastoid aircells and included paranasal sinuses are well-aerated. OTHER: None. CTA NECK AORTIC ARCH: Normal appearance of the thoracic arch, normal branch pattern. The origins of the innominate, left Common carotid artery and subclavian artery are widely patent. RIGHT CAROTID SYSTEM: Common carotid artery is widely patent, coursing in a straight line fashion. Normal appearance of the carotid bifurcation without hemodynamically significant stenosis by NASCET criteria. Normal appearance of the included internal carotid artery. LEFT CAROTID SYSTEM: Common carotid artery is widely patent, coursing in a straight line fashion. Normal appearance of the carotid bifurcation without hemodynamically significant stenosis by NASCET criteria. Normal appearance of the included internal carotid artery. VERTEBRAL ARTERIES:Codominant vertebral artery's. 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 No intimal hematoma. No flow limiting stenosis. SKELETON: No acute osseous process though bone windows have not been submitted. Mild degenerative change of the cervical spine without significant neural foraminal  narrowing. OTHER NECK: Soft tissues of the neck are non-acute though, not tailored for evaluation. Effaced LEFT piriform sinus, normal variant. CTA HEAD ANTERIOR CIRCULATION: Normal appearance of the cervical internal carotid arteries, petrous, cavernous and supra clinoid internal carotid arteries. Widely patent anterior communicating artery. Normal appearance of the anterior and middle cerebral arteries. No large vessel occlusion, hemodynamically significant stenosis, dissection, luminal irregularity, contrast extravasation or aneurysm. POSTERIOR CIRCULATION: Normal appearance of the vertebral arteries, vertebrobasilar junction  and basilar artery, as well as main branch vessels. Normal appearance of the posterior cerebral arteries. No large vessel occlusion, hemodynamically significant stenosis, dissection, luminal irregularity, contrast extravasation or aneurysm. VENOUS SINUSES: Major dural venous sinuses are patent though not tailored for evaluation on this angiographic examination. ANATOMIC VARIANTS: None. DELAYED PHASE: No abnormal intracranial enhancement. IMPRESSION: CT HEAD:  Negative. 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 No hemodynamically significant stenosis. CTA HEAD:  Negative. Electronically Signed   By: Awilda Metro M.D.   On: 03/06/2016 20:04   Ct Angio Neck W And/or Wo Contrast  Result Date: 03/06/2016 CLINICAL DATA:  Slurred speech for 1 minutes, LEFT arm weakness for 2 hours. Assess transient ischemic attack. EXAM: CT ANGIOGRAPHY HEAD AND NECK TECHNIQUE: Multidetector CT imaging of the head and neck was performed using the standard protocol during bolus administration of intravenous contrast. Multiplanar CT image reconstructions and MIPs were obtained to evaluate the vascular anatomy. Carotid stenosis measurements (when applicable) are obtained utilizing NASCET criteria, using the distal internal carotid diameter as the denominator. CONTRAST:   50 cc Isovue 370 COMPARISON:  None. FINDINGS: CT HEAD FINDINGS BRAIN: The ventricles and sulci are normal. No intraparenchymal hemorrhage, mass effect nor midline shift. No acute large vascular territory infarcts. No abnormal extra-axial fluid collections. Basal cisterns are patent. VASCULAR: Unremarkable. SKULL/SOFT TISSUES: No skull fracture. No significant soft tissue swelling. ORBITS/SINUSES: The included ocular globes and orbital contents are normal.The mastoid aircells and included paranasal sinuses are well-aerated. OTHER: None. CTA NECK AORTIC ARCH: Normal appearance of the thoracic arch, normal branch pattern. The origins of the innominate, left Common carotid artery and subclavian artery are widely patent. RIGHT CAROTID SYSTEM: Common carotid artery is widely patent, coursing in a straight line fashion. Normal appearance of the carotid bifurcation without hemodynamically significant stenosis by NASCET criteria. Normal appearance of the included internal carotid artery. LEFT CAROTID SYSTEM: Common carotid artery is widely patent, coursing in a straight line fashion. Normal appearance of the carotid bifurcation without hemodynamically significant stenosis by NASCET criteria. Normal appearance of the included internal carotid artery. VERTEBRAL ARTERIES:Codominant vertebral artery's. 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 No intimal hematoma. No flow limiting stenosis. SKELETON: No acute osseous process though bone windows have not been submitted. Mild degenerative change of the cervical spine without significant neural foraminal narrowing. OTHER NECK: Soft tissues of the neck are non-acute though, not tailored for evaluation. Effaced LEFT piriform sinus, normal variant. CTA HEAD ANTERIOR CIRCULATION: Normal appearance of the cervical internal carotid arteries, petrous, cavernous and supra clinoid internal carotid arteries. Widely patent anterior communicating  artery. Normal appearance of the anterior and middle cerebral arteries. No large vessel occlusion, hemodynamically significant stenosis, dissection, luminal irregularity, contrast extravasation or aneurysm. POSTERIOR CIRCULATION: Normal appearance of the vertebral arteries, vertebrobasilar junction and basilar artery, as well as main branch vessels. Normal appearance of the posterior cerebral arteries. No large vessel occlusion, hemodynamically significant stenosis, dissection, luminal irregularity, contrast extravasation or aneurysm. VENOUS SINUSES: Major dural venous sinuses are patent though not tailored for evaluation on this angiographic examination. ANATOMIC VARIANTS: None. DELAYED PHASE: No abnormal intracranial enhancement. IMPRESSION: CT HEAD:  Negative. 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 No hemodynamically significant stenosis. CTA HEAD:  Negative. Electronically Signed   By: Awilda Metro M.D.   On: 03/06/2016 20:04    Chart has been reviewed    Assessment/Plan  45 y.o. 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 Order PT/OT evaluation. Will hold antiplatelet agent.   Neurology consulted.   Stroke team to decide on long term anticoagulation goals.    . Asthma mild continue home medication   Other plan as per orders.  DVT prophylaxis:  SCD    Code Status:  FULL CODE  as per patient   Family Communication:   Family not  at  Bedside    Disposition Plan:   To home once workup is complete and patient is stable                        Would benefit from PT/OT eval prior to DC  ordered                                                Consults called: Neurology   Admission status: obs   Level of care     tele           I have spent a total of66 min on this admission  Extra time spent to discuss case with neurology  Alik Mawson 03/06/2016, 8:59 PM    [facility]  Pager [phone]   after 2 AM please page floor coverage PA If 7AM-7PM, please contact the day team taking care of the patient  [email or web address]  Password [email or web address]

## 2016-03-06 NOTE — ED Notes (Signed)
Admit Doctor stated still can go to telemetry after speaking with Neurology.

## 2016-03-07 ENCOUNTER — Observation Stay (HOSPITAL_COMMUNITY): Payer: 59

## 2016-03-07 ENCOUNTER — Ambulatory Visit: Payer: Self-pay | Admitting: Family Medicine

## 2016-03-07 ENCOUNTER — Observation Stay (HOSPITAL_BASED_OUTPATIENT_CLINIC_OR_DEPARTMENT_OTHER): Payer: 59

## 2016-03-07 DIAGNOSIS — G451 Carotid artery syndrome (hemispheric): Secondary | ICD-10-CM | POA: Diagnosis not present

## 2016-03-07 DIAGNOSIS — I6789 Other cerebrovascular disease: Secondary | ICD-10-CM | POA: Diagnosis not present

## 2016-03-07 DIAGNOSIS — G459 Transient cerebral ischemic attack, unspecified: Secondary | ICD-10-CM | POA: Diagnosis not present

## 2016-03-07 DIAGNOSIS — I7774 Dissection of vertebral artery: Secondary | ICD-10-CM | POA: Diagnosis not present

## 2016-03-07 DIAGNOSIS — Q211 Atrial septal defect: Secondary | ICD-10-CM | POA: Diagnosis not present

## 2016-03-07 DIAGNOSIS — G450 Vertebro-basilar artery syndrome: Secondary | ICD-10-CM

## 2016-03-07 DIAGNOSIS — Q2112 Patent foramen ovale: Secondary | ICD-10-CM

## 2016-03-07 LAB — ECHOCARDIOGRAM COMPLETE
CHL CUP DOP CALC LVOT VTI: 25.1 cm
CHL CUP MV DEC (S): 201
CHL CUP RV SYS PRESS: 19 mmHg
E decel time: 201 msec
E/e' ratio: 7.76
FS: 35 % (ref 28–44)
HEIGHTINCHES: 67 in
IV/PV OW: 0.94
LA ID, A-P, ES: 26 mm
LA vol A4C: 36.2 ml
LA vol index: 22.2 mL/m2
LA vol: 40.4 mL
LADIAMINDEX: 1.43 cm/m2
LEFT ATRIUM END SYS DIAM: 26 mm
LV E/e' medial: 7.76
LV E/e'average: 7.76
LV PW d: 8.07 mm — AB (ref 0.6–1.1)
LV TDI E'MEDIAL: 7.54
LVELAT: 11.5 cm/s
LVOT area: 2.84 cm2
LVOT peak grad rest: 8 mmHg
LVOTD: 19 mm
LVOTPV: 138 cm/s
LVOTSV: 71 mL
Lateral S' vel: 14.7 cm/s
MV Peak grad: 3 mmHg
MV pk A vel: 68.4 m/s
MV pk E vel: 89.2 m/s
Reg peak vel: 203 cm/s
TAPSE: 33.4 mm
TDI e' lateral: 11.5
TR max vel: 203 cm/s
WEIGHTICAEL: 2457.6 [oz_av]

## 2016-03-07 LAB — RAPID URINE DRUG SCREEN, HOSP PERFORMED
Amphetamines: NOT DETECTED
BARBITURATES: NOT DETECTED
Benzodiazepines: NOT DETECTED
COCAINE: NOT DETECTED
OPIATES: NOT DETECTED
Tetrahydrocannabinol: NOT DETECTED

## 2016-03-07 LAB — LIPID PANEL
Cholesterol: 163 mg/dL (ref 0–200)
HDL: 78 mg/dL (ref >40)
LDL CALC: 76 mg/dL (ref 0–99)
TRIGLYCERIDES: 45 mg/dL (ref <150)
Total CHOL/HDL Ratio: 2.1 RATIO
VLDL: 9 mg/dL (ref 0–40)

## 2016-03-07 LAB — CBC
HCT: 39.2 % (ref 36.0–46.0)
Hemoglobin: 13 g/dL (ref 12.0–15.0)
MCH: 30.3 pg (ref 26.0–34.0)
MCHC: 33.2 g/dL (ref 30.0–36.0)
MCV: 91.4 fL (ref 78.0–100.0)
Platelets: 209 10*3/uL (ref 150–400)
RBC: 4.29 MIL/uL (ref 3.87–5.11)
RDW: 13.4 % (ref 11.5–15.5)
WBC: 4.7 10*3/uL (ref 4.0–10.5)

## 2016-03-07 LAB — HEPARIN LEVEL (UNFRACTIONATED)
HEPARIN UNFRACTIONATED: 0.25 [IU]/mL — AB (ref 0.30–0.70)
Heparin Unfractionated: 0.27 IU/mL — ABNORMAL LOW (ref 0.30–0.70)

## 2016-03-07 MED ORDER — CLOPIDOGREL BISULFATE 75 MG PO TABS: 75.0000 mg | ORAL_TABLET | Freq: Every day | ORAL | 0 refills | Status: DC

## 2016-03-07 MED ORDER — ASPIRIN 81 MG PO CHEW
81.0000 mg | CHEWABLE_TABLET | Freq: Every day | ORAL | Status: DC
  Administered 2016-03-07 – 2016-03-08 (×2): 81 mg via ORAL
  Filled 2016-03-07 (×2): qty 1

## 2016-03-07 MED ORDER — ATORVASTATIN CALCIUM 10 MG PO TABS
10.0000 mg | ORAL_TABLET | Freq: Every day | ORAL | Status: DC
  Administered 2016-03-07 – 2016-03-08 (×2): 10 mg via ORAL
  Filled 2016-03-07 (×2): qty 1

## 2016-03-07 MED ORDER — HEPARIN SODIUM (PORCINE) 5000 UNIT/ML IJ SOLN
5000.0000 [IU] | Freq: Three times a day (TID) | INTRAMUSCULAR | Status: DC
  Administered 2016-03-07 – 2016-03-08 (×2): 5000 [IU] via SUBCUTANEOUS
  Filled 2016-03-07 (×3): qty 1

## 2016-03-07 MED ORDER — CLOPIDOGREL BISULFATE 75 MG PO TABS
75.0000 mg | ORAL_TABLET | Freq: Every day | ORAL | Status: DC
  Administered 2016-03-07 – 2016-03-08 (×2): 75 mg via ORAL
  Filled 2016-03-07 (×2): qty 1

## 2016-03-07 MED ORDER — ASPIRIN 81 MG PO CHEW: 81.0000 mg | CHEWABLE_TABLET | Freq: Every day | ORAL | 0 refills | Status: DC

## 2016-03-07 NOTE — Progress Notes (Signed)
ANTICOAGULATION CONSULT NOTE - Follow Up Consult  Pharmacy Consult for heparin Indication: carotid dissection  Labs:  Recent Labs  03/06/16 1503 03/06/16 2259 03/07/16 0633  HGB 13.5  --  13.0  HCT 41.4  --  39.2  PLT 221  --  209  APTT 33  --   --   LABPROT 12.6  --   --   INR 0.94  --   --   HEPARINUNFRC  --   --  0.25*  CREATININE 0.86  --   --   TROPONINI  --  <0.03  --     Assessment: 45yo female subtherapeutic on heparin with initial dosing for carotid dissection.  Goal of Therapy:  Heparin level 0.3-0.5 units/ml   Plan:  Will increase heparin gtt slightly to 900 units/hr and check level in 6hr.  Vernard GamblesVeronda Nailyn Dearinger, PharmD, BCPS  03/07/2016,7:25 AM

## 2016-03-07 NOTE — Progress Notes (Signed)
Echocardiogram 2D Echocardiogram has been performed.  Janice Myers 03/07/2016, 3:31 PM

## 2016-03-07 NOTE — Evaluation (Addendum)
Speech Language Pathology Evaluation Patient Details Name: Janice Myers MRN: [id number] DOB: 09/12/1970 Today's Date: 03/07/2016 Time: [id number] SLP Time Calculation (min) (ACUTE ONLY): 15 min  Problem List:  Patient Active Problem List   Diagnosis Date Noted  . TIA (transient ischemic attack) 03/06/2016  . Asthma 03/06/2016  . 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 Reactive depression 01/01/2016   Past Medical History:  Past Medical History:  Diagnosis Date  . Asthma   . Seasonal allergies    Past Surgical History:  Past Surgical History:  Procedure Laterality Date  . ANTERIOR CRUCIATE LIGAMENT REPAIR  2013  . KELOID EXCISION  1992   HPI:  Janice Myers a 45 y.o.femalewith medical history significant of asthma and depression. Presented with slurred and garbled speech that occurred at 7:30 AM yesterday on 19th of Decemberstates her words was not informed to properly lasting for a few seconds to a minute and left arm numbness and weakness, she couldn't make a fist. she held her arms up and noticed that the left arm was drifting down that lasted about 2 hours pateintreportssymptoms got better during the day and currently has resolved. She reports that few weeks ago she had needling done. Normal MRI of the brain on 03/07/16.  No history of any speech-langauge deficits noted in chart.    Assessment / Plan / Recommendation Clinical Impression  Pt's cognitive linguistic abilities appear to be functional at this time. Pt reports that "grabled speech only last for a few seconds" on 03/06/16 but have now resulted. Pt able to speech intelligibility at the simple converation level and comprehends/expresses ideas functionally. Nursing reports that pt appears cognitively intact. No further services are indicated at this time. ST to sign off.     SLP Assessment  Patient does not need any further Speech Lanaguage Pathology Services    Follow Up Recommendations  None         SLP Evaluation Cognition  Overall Cognitive Status: Within Functional Limits for tasks assessed Arousal/Alertness: Awake/alert Orientation Level: Oriented X4 Attention: Selective Selective Attention: Appears intact Memory: Appears intact Awareness: Appears intact Problem Solving: Appears intact Safety/Judgment: Appears intact       Comprehension  Auditory Comprehension Overall Auditory Comprehension: Appears within functional limits for tasks assessed Yes/No Questions: Within Functional Limits Commands: Within Functional Limits Conversation: Simple Visual Recognition/Discrimination Discrimination: Not tested Reading Comprehension Reading Status: Within funtional limits    Expression Expression Primary Mode of Expression: Verbal Verbal Expression Overall Verbal Expression: Appears within functional limits for tasks assessed Initiation: No impairment Level of Generative/Spontaneous Verbalization: Conversation Repetition: No impairment Naming: No impairment Pragmatics: No impairment Non-Verbal Means of Communication: Not applicable Written Expression Written Expression: Not tested   Oral / Motor  Oral Motor/Sensory Function Overall Oral Motor/Sensory Function: Within functional limits Motor Speech Overall Motor Speech: Appears within functional limits for tasks assessed Respiration: Within functional limits Phonation: Normal Resonance: Within functional limits Articulation: Within functional limitis Intelligibility: Intelligible Motor Planning: Witnin functional limits Motor Speech Errors: Not applicable   GO          Functional Assessment Tool Used: Skilled clinical observation Functional Limitations: Spoken language expressive Spoken Language Expression Current Status [phone](G9162): 0 percent impaired, limited or restricted Spoken Language Expression Goal Status ([id number](G9163): 0 percent impaired, limited or restricted Spoken Language Expression Discharge Status [phone](G9164):  0 percent impaired, limited or restricted        Bryona Foxworthy B. Dreama Saaverton, M.S., CCC-SLP Speech-Language Pathologist  Kabe Mckoy 03/07/2016, 9:11 AM

## 2016-03-07 NOTE — Progress Notes (Signed)
Transcranial Bubble Study Completed.  Risk/benefits explained, RMCA was insonated, and left forearm IV was used. Dr. Roda ShuttersXu performed the procedure. HITS heard at rest - curtain effect, HITS heard at [place] - short curtain effect. 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  Janice Myers, BS, RDMS, RVT

## 2016-03-07 NOTE — Progress Notes (Signed)
Preliminary results by tech - Lower Ext. Venous Duplex Completed. Negative for deep and superficial vein thrombosis in both legs. Vidal Lampkins, BS, RDMS, RVT  

## 2016-03-07 NOTE — Progress Notes (Signed)
PT Cancellation Note  Patient Details Name: Janice Myers MRN: [id number] DOB: 03/20/1970   Cancelled Treatment:    Reason Eval/Treat Not Completed: PT screened, no needs identified, will sign off. Spoke with pt prior to leaving the unit for testing. Pt states that she is very active at baseline and is currently doing obstacle course training. At this time she reports return to baseline and does not feel that she has any lingering symptoms - denies numbness, tingling, weakness, headache. Only complaint is neck/shoulder pain that has been present for "a few weeks" that pt feels is a result of her training. She is currently seeing a physical therapist for dry needling. Will sign off at this time as pt reports no acute needs. Please reconsult if needs change.   Janice Myers 03/07/2016, 2:32 PM  Janice Myers, PT, DPT Acute Rehabilitation Services Pager: [phone]

## 2016-03-07 NOTE — Progress Notes (Signed)
STROKE TEAM PROGRESS NOTE   HISTORY OF PRESENT ILLNESS (per record) Janice AmberMaria Samoladasis a 45 y.o.femalewho presents with transient left-arm weakness and slurred speech. She states that her child asked her a question, and when she responded it sounded "like really drunk person." This only lasted for approximately a minute, but she noticed that her left arm did not seem to be functioning properly. She describes it is heavy like it required more effort to do anything than she typically would need. This lasted longer, approximately 2 hours. She did not feel any numbness, just weakness.  She has been looking things up on the Internet and became concerned when she realized that this could be consistent with TIA and therefore sought care in the emergency department today.  She denies any drug use or tobacco use. She does not drink. She denies headache.  She was LKW Yesterday 03/05/2016 in the morning. Patient was not administered IV t-PA secondary to being out of window, resolved symptoms. She was admitted for further evaluation and treatment.   SUBJECTIVE (INTERVAL HISTORY) Patient was seen and examined this morning with her husband and three son at bedside. She confirmed the events of yesterday stating that as she was speaking to her youngest on yesterday, her speech came out garbled. She knew her speech sounded funny. This lasted for about 1 minute and resolved. However, later she developed numbness in her left arm that she describes as a "ghostly" feeling associated with heaviness feeding. She denies any associated facial numbness, dizziness, or lightheadedness, nausea, vomiting, or vertigo. Patient states she had associated left neck and shoulder stiffness at that same time which had been intermittent for the past week.   Patient does report a new, aggressive work out regimen where she lifts heavy buckets filled with cement, does burpees, runs with sandbags, etc.    OBJECTIVE Temp:  [97.7 F  (36.5 C)-98.4 F (36.9 C)] 98.1 F (36.7 C) (12/21 1000) Pulse Rate:  [53-81] 81 (12/21 1000) Cardiac Rhythm: Sinus bradycardia;Normal sinus rhythm (12/21 0700) Resp:  [14-21] 16 (12/21 1000) BP: (103-164)/(67-97) 112/77 (12/21 1000) SpO2:  [69 %-100 %] 99 % (12/21 1000) Weight:  [67.6 kg (149 lb)-69.7 kg (153 lb 9.6 oz)] 69.7 kg (153 lb 9.6 oz) (12/20 2130)  CBC:  Recent Labs Lab 03/06/16 1503 03/07/16 0633  WBC 5.9 4.7  NEUTROABS 3.3  --   HGB 13.5 13.0  HCT 41.4 39.2  MCV 91.6 91.4  PLT 221 209    Basic Metabolic Panel:  Recent Labs Lab 03/06/16 1503  NA 137  K 3.9  CL 104  CO2 25  GLUCOSE 91  BUN 11  CREATININE 0.86  CALCIUM 9.3    Lipid Panel:    Component Value Date/Time   CHOL 163 03/07/2016 0633   TRIG 45 03/07/2016 0633   HDL 78 03/07/2016 0633   CHOLHDL 2.1 03/07/2016 0633   VLDL 9 03/07/2016 0633   LDLCALC 76 03/07/2016 0633   HgbA1c:  Lab Results  Component Value Date   HGBA1C 5.3 01/01/2016    IMAGING I have personally reviewed the radiological images below and agree with the radiology interpretations.  Dg Chest 2 View 03/07/2016 Mild hyperinflation consistent with known reactive airway disease. No acute cardiopulmonary abnormality. Thoracic aortic atherosclerosis.   CT HEAD 03/06/2016 Negative.   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 No hemodynamically significant stenosis.   CTA HEAD 03/06/2016 Negative.   MRI HEAD  03/07/2016 Normal MRI  of the brain.   MRA HEAD  03/07/2016 Normal intracranial MRA.   Lower Ext. Venous Duplex Completed. Negative for deep and superficial vein thrombosis in both legs.   TCD bubble study - moderate PFO  TTE - pending  TEE pending   PHYSICAL EXAM  Temp:  [98.1 F (36.7 C)-98.4 F (36.9 C)] 98.1 F (36.7 C) (12/21 1000) Pulse Rate:  [54-81] 81 (12/21 1000) Resp:  [14-18] 16 (12/21 1000) BP: (103-141)/(67-86) 112/77  (12/21 1000) SpO2:  [95 %-99 %] 99 % (12/21 1000) Weight:  [153 lb 1.6 oz (69.4 kg)-153 lb 9.6 oz (69.7 kg)] 153 lb 9.6 oz (69.7 kg) (12/20 2130)  General - Well nourished, well developed, in no apparent distress.  Ophthalmologic - Sharp disc margins OU.  Cardiovascular - Regular rate and rhythm.  Mental Status -  Level of arousal and orientation to time, place, and person were intact. Language including expression, naming, repetition, comprehension was assessed and found intact. Attention span and concentration were normal. Recent and remote memory were intact. Fund of Knowledge was assessed and was intact.  Cranial Nerves II - XII - II - Visual field intact OU. III, IV, VI - Extraocular movements intact. V - Facial sensation intact bilaterally. VII - Facial movement intact bilaterally. VIII - Hearing & vestibular intact bilaterally. X - Palate elevates symmetrically. XI - Chin turning & shoulder shrug intact bilaterally. XII - Tongue protrusion intact.  Motor Strength - The patient's strength was normal in all extremities and pronator drift was absent.  Bulk was normal and fasciculations were absent.   Motor Tone - Muscle tone was assessed at the neck and appendages and was normal.  Reflexes - The patient's reflexes were 1+ in all extremities and she had no pathological reflexes.  Sensory - Light touch, temperature/pinprick, vibration and proprioception, and Romberg testing were assessed and were symmetrical.    Coordination - The patient had normal movements in the hands and feet with no ataxia or dysmetria.  Tremor was absent.  Gait and Station - The patient's transfers, posture, gait, station, and turns were observed as normal.   ASSESSMENT/PLAN [name] is a 45 y.o. female with history of asthma and seasonal allergies presenting with transient left arm weakness and slurred speech. She did not receive IV t-PA due to delay in arrival, resolved symptoms.   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 Yes; Fluid consistency: Thin  No antithrombotic prior to admission, now on heparin IV. Do not think she needs IV heparin for VA dissection. Recommend to change to ASA and plavix for 3-6 months. Will repeat CTA neck in 3 months to guide the duration of therapy  Therapy recommendations:  pending   Disposition: home likely   Hyperlipidemia  Home meds:  No statin  LDL 76  Put on low dose lipitor  Continue lipitor at discharge.   Other Stroke Risk Factors  ETOH use, advised to  drink no more than 1 drink(s) a day  Family hx stroke (mother)  Other Active Problems  Asthma   Hospital day # 0  Marvel Plan, MD PhD Stroke Neurology 03/07/2016 5:26 PM    To contact Stroke Continuity provider, please refer to [email or web address]. After hours, contact General Neurology

## 2016-03-07 NOTE — Progress Notes (Signed)
OT Cancellation Note  Patient Details Name: Janice Myers MRN: [id number] DOB: 06/18/1970   Cancelled Treatment:    Reason Eval/Treat Not Completed: OT screened, no needs identified, will sign off. Symptoms for which she was admitted have resolved.   Evern BioMayberry, Bernise Sylvain Lynn 03/07/2016, 2:34 PM  [phone]

## 2016-03-07 NOTE — Discharge Summary (Addendum)
Physician Discharge Summary  Janice Myers ZOX:[id number] DOB: 08-29-1970 DOA: 03/06/2016  PCP: Abbe Amsterdam, MD  Admit date: 03/06/2016 Discharge date: 03/08/2016  Admitted From: Home Disposition:  Home  Recommendations for Outpatient Follow-up:  1. Follow up with PCP in 1-2 weeks 2. Follow up with Neurology in 6 weeks, ambulatory referral placed  3. Follow up with Cardiology Dr. Excell Seltzer regarding PFO closure. Dr. Roda Shutters (neurology) will get in touch with Dr. Earmon Phoenix office   Home Health: No  Equipment/Devices: None   Discharge Condition: Stable CODE STATUS: Full  Diet recommendation: Heart healthy   Brief/Interim Summary: From H&P: Janice Myers a 45 y.o.femalewith medical history significant of asthma and depression. She presented after having slurred and garbled speech that occurred 7:30 AM on 12/19. She also admitted to some left arm numbness and weakness. She was admitted for TIA work up. She underwent MRI, MRA head which was unremarkable. 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 She was evaluated by stroke team echocardiogram, carotid Doppler, transcranial Doppler, TEE were obtained during hospitalization. Results as below. She'll be maintained on aspirin and Plavix for 3-6 months and repeat CTA neck in 3 months to guide duration of therapy. Work up revealed moderate PFO. Neurology recommends follow up with cardiology for possible PFO closure.   Subjective on day of discharge: Her symptoms of dysarthria and left arm weakness and numbness has now resolved. No complaints currently. Denies any headache, vision changes, confusion, chest pain, shortness of breath, nausea or vomiting.   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  -Follow up with neurology in 6 weeks, cardiology appointment to be set up to discuss possible PFO closure.   Discharge Instructions  Discharge Instructions    Ambulatory referral to Neurology    Complete by:  As directed    An appointment is requested in approximately: 6 weeks   Diet - low sodium heart healthy    Complete by:  As directed    Increase activity slowly    Complete by:  As directed      Allergies as of 03/08/2016   No Known Allergies     Medication List    TAKE these medications   albuterol 108 (90 Base) MCG/ACT inhaler Commonly known as:  PROVENTIL HFA;VENTOLIN HFA Inhale 1 puff into the lungs every 6 (six) hours as needed for wheezing or shortness of breath.   aspirin 81 MG chewable tablet Chew 1 tablet (81 mg total) by mouth daily.   atorvastatin 10 MG tablet Commonly known as:  LIPITOR Take 1 tablet (10 mg total) by mouth daily at 6 PM.   clopidogrel 75 MG tablet Commonly known as:  PLAVIX Take 1 tablet (75 mg total) by mouth daily.      Follow-up Information    COPLAND,JESSICA, MD. Schedule an appointment as soon as possible for a visit in 1 week(s).   Specialty:  Family Medicine Contact information: [address] [place]  [address] [phone]        [facility]. Schedule an appointment as soon as possible for a visit in 6 week(s).   Why:  Ambulatory referral placed Contact information: [address]     [address] [place] [address] [phone]       Tonny Bollman, MD. Call in 1 week(s).   Specialty:  Cardiology Why:  Regarding PFO closure. Dr. Roda Shutters (neurology) will get in  touch with Dr. Earmon Phoenix office regarding appointment. If you do not hear from their office in 1-2 weeks, give them a call.  Contact information: [address] [place] [address] [phone]          No Known Allergies  Consultations:  Neurology    Procedures/Studies: Ct Angio Head W Or Wo Contrast  Result Date: 03/06/2016 CLINICAL DATA:  Slurred speech for 1 minutes, LEFT arm weakness for 2 hours. Assess transient ischemic attack. EXAM: CT ANGIOGRAPHY HEAD AND NECK TECHNIQUE: Multidetector CT imaging of the head and neck was performed using the standard protocol during bolus administration of intravenous contrast. Multiplanar CT image reconstructions and MIPs were obtained to evaluate the vascular anatomy. Carotid stenosis measurements (when applicable) are obtained utilizing NASCET criteria, using the distal internal carotid diameter as the denominator. CONTRAST:  50 cc Isovue 370 COMPARISON:  None. FINDINGS: CT HEAD FINDINGS BRAIN: The ventricles and sulci are normal. No intraparenchymal hemorrhage, mass effect nor midline shift. No acute large vascular territory infarcts. No abnormal extra-axial fluid collections. Basal cisterns are patent. VASCULAR: Unremarkable. SKULL/SOFT TISSUES: No skull fracture. No significant soft tissue swelling. ORBITS/SINUSES: The included ocular globes and orbital contents are normal.The mastoid aircells and included paranasal sinuses are well-aerated. OTHER: None. CTA NECK AORTIC ARCH: Normal appearance of the thoracic arch, normal branch pattern. The origins of the innominate, left Common carotid artery and subclavian artery are widely patent. RIGHT CAROTID SYSTEM: Common carotid artery is widely patent, coursing in a straight line fashion. Normal appearance of the carotid bifurcation without hemodynamically significant stenosis by NASCET criteria. Normal appearance of the included internal carotid artery. LEFT CAROTID SYSTEM: Common  carotid artery is widely patent, coursing in a straight line fashion. Normal appearance of the carotid bifurcation without hemodynamically significant stenosis by NASCET criteria. Normal appearance of the included internal carotid artery. VERTEBRAL ARTERIES:Codominant vertebral artery's. 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 No intimal hematoma. No flow limiting stenosis. SKELETON: No acute osseous process though bone windows have not been submitted. Mild degenerative change of the cervical spine without significant neural foraminal narrowing. OTHER NECK: Soft tissues of the neck are non-acute though, not tailored for evaluation. Effaced LEFT piriform sinus, normal variant. CTA HEAD ANTERIOR CIRCULATION: Normal appearance of the cervical internal carotid arteries, petrous, cavernous and supra clinoid internal carotid arteries. Widely patent anterior communicating artery. Normal appearance of the anterior and middle cerebral arteries. No large vessel occlusion, hemodynamically significant stenosis, dissection, luminal irregularity, contrast extravasation or aneurysm. POSTERIOR CIRCULATION: Normal appearance of the vertebral arteries, vertebrobasilar junction and basilar artery, as well as main branch vessels. Normal appearance of the posterior cerebral arteries. No large vessel occlusion, hemodynamically significant stenosis, dissection, luminal irregularity, contrast extravasation or aneurysm. VENOUS SINUSES: Major dural venous sinuses are patent though not tailored for evaluation on this angiographic examination. ANATOMIC VARIANTS: None. DELAYED PHASE: No abnormal intracranial enhancement. IMPRESSION: CT HEAD:  Negative. 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 No  hemodynamically significant stenosis. CTA HEAD:  Negative. Electronically Signed   By: Awilda Metroourtnay  Bloomer M.D.   On: 03/06/2016 20:04   Dg Chest 2 View  Result  Date: 03/07/2016 CLINICAL DATA:  TIA 2 days ago with persistent weakness. History of asthma, carotid artery dissection. EXAM: CHEST  2 VIEW COMPARISON:  None. FINDINGS: The lungs arm mildly hyperinflated. There is no focal infiltrate. There is no pleural effusion or pneumothorax. The heart and pulmonary vascularity are normal. The mediastinum is normal in width. There is calcification in the wall of the aortic arch. The bony thorax is unremarkable. IMPRESSION: Mild hyperinflation consistent with known reactive airway disease. No acute cardiopulmonary abnormality. Thoracic aortic atherosclerosis. Electronically Signed   By: David  SwazilandJordan M.D.   On: 03/07/2016 07:45   Ct Angio Neck W And/or Wo Contrast  Result Date: 03/06/2016 CLINICAL DATA:  Slurred speech for 1 minutes, LEFT arm weakness for 2 hours. Assess transient ischemic attack. EXAM: CT ANGIOGRAPHY HEAD AND NECK TECHNIQUE: Multidetector CT imaging of the head and neck was performed using the standard protocol during bolus administration of intravenous contrast. Multiplanar CT image reconstructions and MIPs were obtained to evaluate the vascular anatomy. Carotid stenosis measurements (when applicable) are obtained utilizing NASCET criteria, using the distal internal carotid diameter as the denominator. CONTRAST:  50 cc Isovue 370 COMPARISON:  None. FINDINGS: CT HEAD FINDINGS BRAIN: The ventricles and sulci are normal. No intraparenchymal hemorrhage, mass effect nor midline shift. No acute large vascular territory infarcts. No abnormal extra-axial fluid collections. Basal cisterns are patent. VASCULAR: Unremarkable. SKULL/SOFT TISSUES: No skull fracture. No significant soft tissue swelling. ORBITS/SINUSES: The included ocular globes and orbital contents are normal.The mastoid aircells and included paranasal sinuses are well-aerated. OTHER: None. CTA NECK AORTIC ARCH: Normal appearance of the thoracic arch, normal branch pattern. The origins of the  innominate, left Common carotid artery and subclavian artery are widely patent. RIGHT CAROTID SYSTEM: Common carotid artery is widely patent, coursing in a straight line fashion. Normal appearance of the carotid bifurcation without hemodynamically significant stenosis by NASCET criteria. Normal appearance of the included internal carotid artery. LEFT CAROTID SYSTEM: Common carotid artery is widely patent, coursing in a straight line fashion. Normal appearance of the carotid bifurcation without hemodynamically significant stenosis by NASCET criteria. Normal appearance of the included internal carotid artery. VERTEBRAL ARTERIES:Codominant vertebral artery's. 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 No intimal hematoma. No flow limiting stenosis. SKELETON: No acute osseous process though bone windows have not been submitted. Mild degenerative change of the cervical spine without significant neural foraminal narrowing. OTHER NECK: Soft tissues of the neck are non-acute though, not tailored for evaluation. Effaced LEFT piriform sinus, normal variant. CTA HEAD ANTERIOR CIRCULATION: Normal appearance of the cervical internal carotid arteries, petrous, cavernous and supra clinoid internal carotid arteries. Widely patent anterior communicating artery. Normal appearance of the anterior and middle cerebral arteries. No large vessel occlusion, hemodynamically significant stenosis, dissection, luminal irregularity, contrast extravasation or aneurysm. POSTERIOR CIRCULATION: Normal appearance of the vertebral arteries, vertebrobasilar junction and basilar artery, as well as main branch vessels. Normal appearance of the posterior cerebral arteries. No large vessel occlusion, hemodynamically significant stenosis, dissection, luminal irregularity, contrast extravasation or aneurysm. VENOUS SINUSES: Major dural venous sinuses are patent though not tailored for evaluation on this  angiographic examination. ANATOMIC VARIANTS: None. DELAYED PHASE: No abnormal intracranial enhancement. IMPRESSION: CT HEAD:  Negative. CTA NECK: RIGHT V2 non flow limiting dissection with 3 mm intact pseudo  aneurysm. No hemodynamically significant stenosis. CTA HEAD:  Negative. Electronically Signed   By: Awilda Metroourtnay  Bloomer M.D.   On: 03/06/2016 20:04   Mr Brain Wo Contrast  Result Date: 03/07/2016 CLINICAL DATA:  Initial evaluation for acute slurred speech. Left-sided numbness and weakness. EXAM: MRI HEAD WITHOUT CONTRAST MRA HEAD WITHOUT CONTRAST TECHNIQUE: Multiplanar, multiecho pulse sequences of the brain and surrounding structures were obtained without intravenous contrast. Angiographic images of the head were obtained using MRA technique without contrast. COMPARISON:  Prior CTA from earlier the same day. FINDINGS: MRI HEAD FINDINGS Brain: Cerebral volume within normal limits for age. No focal parenchymal signal abnormality identified. No abnormal foci of restricted diffusion to suggest acute or subacute ischemia. Gray-white matter differentiation well maintained. No acute or chronic intracranial hemorrhage. No encephalomalacia to suggest chronic infarction. No mass lesion, midline shift, or mass effect. No hydrocephalus. No extra-axial fluid collection. Major dural sinuses are grossly patent. Pituitary gland mildly prominent but within normal limits for gender and age. Suprasellar region normal. Midline structures intact. Vascular: Major intracranial vascular flow voids are maintained. Skull and upper cervical spine: Craniocervical junction normal. Visualized upper cervical spine unremarkable. Bone marrow signal intensity within normal limits. No scalp soft tissue abnormality. Sinuses/Orbits: Globes and orbital soft tissues within normal limits. Mild scattered mucosal thickening within the ethmoidal air cells. Paranasal sinuses are otherwise clear. No mastoid effusion. Inner ear structures normal. MRA  HEAD FINDINGS ANTERIOR CIRCULATION: Distal cervical segments of the internal carotid arteries are widely patent with antegrade flow. Petrous, cavernous, and supraclinoid segments widely patent without stenosis. A1 segments patent. Anterior communicating artery normal. Anterior cerebral arteries widely patent. M1 segments widely patent without stenosis or occlusion. MCA bifurcations normal. Distal MCA branches well opacified and symmetric. POSTERIOR CIRCULATION: Vertebral arteries patent to the vertebrobasilar junction. Posterior inferior cerebral arteries patent proximally. Basilar artery widely patent. Superior cerebellar and posterior cerebral arteries widely patent bilaterally. No aneurysm or vascular malformation. IMPRESSION: MRI HEAD IMPRESSION: Normal MRI of the brain. MRA HEAD IMPRESSION: Normal intracranial MRA. Electronically Signed   By: Rise MuBenjamin  McClintock M.D.   On: 03/07/2016 01:26   Mr Maxine GlennMra Head/brain ZOWo Cm  Result Date: 03/07/2016 CLINICAL DATA:  Initial evaluation for acute slurred speech. Left-sided numbness and weakness. EXAM: MRI HEAD WITHOUT CONTRAST MRA HEAD WITHOUT CONTRAST TECHNIQUE: Multiplanar, multiecho pulse sequences of the brain and surrounding structures were obtained without intravenous contrast. Angiographic images of the head were obtained using MRA technique without contrast. COMPARISON:  Prior CTA from earlier the same day. FINDINGS: MRI HEAD FINDINGS Brain: Cerebral volume within normal limits for age. No focal parenchymal signal abnormality identified. No abnormal foci of restricted diffusion to suggest acute or subacute ischemia. Gray-white matter differentiation well maintained. No acute or chronic intracranial hemorrhage. No encephalomalacia to suggest chronic infarction. No mass lesion, midline shift, or mass effect. No hydrocephalus. No extra-axial fluid collection. Major dural sinuses are grossly patent. Pituitary gland mildly prominent but within normal limits for  gender and age. Suprasellar region normal. Midline structures intact. Vascular: Major intracranial vascular flow voids are maintained. Skull and upper cervical spine: Craniocervical junction normal. Visualized upper cervical spine unremarkable. Bone marrow signal intensity within normal limits. No scalp soft tissue abnormality. Sinuses/Orbits: Globes and orbital soft tissues within normal limits. Mild scattered mucosal thickening within the ethmoidal air cells. Paranasal sinuses are otherwise clear. No mastoid effusion. Inner ear structures normal. MRA HEAD FINDINGS ANTERIOR CIRCULATION: Distal cervical segments of the internal carotid arteries are widely patent with antegrade flow. Petrous,  cavernous, and supraclinoid segments widely patent without stenosis. A1 segments patent. Anterior communicating artery normal. Anterior cerebral arteries widely patent. M1 segments widely patent without stenosis or occlusion. MCA bifurcations normal. Distal MCA branches well opacified and symmetric. POSTERIOR CIRCULATION: Vertebral arteries patent to the vertebrobasilar junction. Posterior inferior cerebral arteries patent proximally. Basilar artery widely patent. Superior cerebellar and posterior cerebral arteries widely patent bilaterally. No aneurysm or vascular malformation. IMPRESSION: MRI HEAD IMPRESSION: Normal MRI of the brain. MRA HEAD IMPRESSION: Normal intracranial MRA. Electronically Signed   By: Rise Mu M.D.   On: 03/07/2016 01:26    Echo  Study Conclusions - Left ventricle: The cavity size was normal. Systolic function was   normal. The estimated ejection fraction was in the range of 60%   to 65%. Wall motion was normal; there were no regional wall   motion abnormalities. Doppler parameters are consistent with   abnormal left ventricular relaxation (grade 1 diastolic   dysfunction). - Aortic valve: Transvalvular velocity was within the normal range.   There was no stenosis. There was no  regurgitation. - Mitral valve: There was trivial regurgitation. - Right ventricle: The cavity size was normal. Wall thickness was   normal. Systolic function was normal. - Tricuspid valve: There was trivial regurgitation.  Venous dopplers Summary: - No evidence of deep vein or superficial thrombosis involving the   right lower extremity and left lower extremity. - No evidence of Baker&'s cyst on the right or left.  Transcranial bubble study HITS heard at rest - curtain effect, HITS heard at valsalva - short curtain effect. 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   TEE - No intracardiac source of embolism.   Positive bubble study consistent with PFO.   Discharge Exam: Vitals:   03/08/16 1550 03/08/16 1600  BP: (!) 97/56 (!) 97/54  Pulse: (!) 58 (!) 57  Resp: 14 14  Temp:     Vitals:   03/08/16 1510 03/08/16 1540 03/08/16 1550 03/08/16 1600  BP: 130/86 (!) 103/56 (!) 97/56 (!) 97/54  Pulse: (!) 58 (!) 57 (!) 58 (!) 57  Resp: 10 13 14 14   Temp:  97.9 F (36.6 C)    TempSrc:  Oral    SpO2: 97% 94% 94% 95%  Weight:      Height:        General: Pt is alert, awake, not in acute distress Cardiovascular: RRR, S1/S2 +, no rubs, no gallops Respiratory: CTA bilaterally, no wheezing, no rhonchi Abdominal: Soft, NT, ND, bowel sounds + Extremities: no edema, no cyanosis Neuro: non focal, no deficits noted, speech clear     The results of significant diagnostics from this hospitalization (including imaging, microbiology, ancillary and laboratory) are listed below for reference.     Microbiology: No results found for this or any previous visit (from the past 240 hour(s)).   Labs: BNP (last 3 results) No results for input(s): BNP in the last 8760 hours. Basic Metabolic Panel:  Recent Labs Lab 03/06/16 1503 03/08/16 0257  NA 137 138  K 3.9 4.0  CL 104 106  CO2 25 26  GLUCOSE 91 102*  BUN 11 11  CREATININE 0.86 0.84  CALCIUM 9.3 9.0   Liver Function Tests:  Recent  Labs Lab 03/06/16 1503  AST 21  ALT 13*  ALKPHOS 36*  BILITOT 0.6  PROT 7.1  ALBUMIN 4.3   No results for input(s): LIPASE, AMYLASE in the last 168 hours. No results for input(s): AMMONIA in the last 168 hours. CBC:  Recent Labs Lab 03/06/16 1503 03/07/16 0633 03/08/16 0257  WBC 5.9 4.7 5.7  NEUTROABS 3.3  --   --   HGB 13.5 13.0 13.0  HCT 41.4 39.2 39.6  MCV 91.6 91.4 93.4  PLT 221 209 206   Cardiac Enzymes:  Recent Labs Lab 03/06/16 2259  TROPONINI <0.03   BNP: Invalid input(s): POCBNP CBG: No results for input(s): GLUCAP in the last 168 hours. D-Dimer No results for input(s): DDIMER in the last 72 hours. Hgb A1c  Recent Labs  03/07/16 0633  HGBA1C 5.3   Lipid Profile  Recent Labs  03/07/16 0633  CHOL 163  HDL 78  LDLCALC 76  TRIG 45  CHOLHDL 2.1   Thyroid function studies No results for input(s): TSH, T4TOTAL, T3FREE, THYROIDAB in the last 72 hours.  Invalid input(s): FREET3 Anemia work up No results for input(s): VITAMINB12, FOLATE, FERRITIN, TIBC, IRON, RETICCTPCT in the last 72 hours. Urinalysis    Component Value Date/Time   COLORURINE STRAW (A) 03/06/2016 1841   APPEARANCEUR CLEAR 03/06/2016 1841   LABSPEC 1.002 (L) 03/06/2016 1841   PHURINE 6.0 03/06/2016 1841   GLUCOSEU NEGATIVE 03/06/2016 1841   HGBUR NEGATIVE 03/06/2016 1841   BILIRUBINUR NEGATIVE 03/06/2016 1841   KETONESUR 5 (A) 03/06/2016 1841   PROTEINUR NEGATIVE 03/06/2016 1841   NITRITE NEGATIVE 03/06/2016 1841   LEUKOCYTESUR MODERATE (A) 03/06/2016 1841   Sepsis Labs Invalid input(s): PROCALCITONIN,  WBC,  LACTICIDVEN Microbiology No results found for this or any previous visit (from the past 240 hour(s)).   Time coordinating discharge: Over 30 minutes  SIGNED:  Noralee Stain, DO [facility] Pager [phone]  If 7PM-7AM, please contact night-coverage [email or web address] Password [facility] 03/08/2016, 4:41 PM

## 2016-03-07 NOTE — Progress Notes (Signed)
PROGRESS NOTE    Janice Myers  MVH:[id number] DOB: 04-Oct-1970 DOA: 03/06/2016 PCP: Abbe Amsterdam, MD     Brief Narrative:  Janice Myers a 45 y.o.femalewith medical history significant of asthma and depression. She presented after having slurred and garbled speech that occurred 7:30 AM on 12/19. She also admitted to some left arm numbness and weakness. She was admitted for TIA work up. She underwent MRI, MRA head which was unremarkable. 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 She was evaluated by stroke team.   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 She denies any focal deficits at this point. She denies any chest pain or shortness of breath.  Objective: Vitals:   03/07/16 0200 03/07/16 0400 03/07/16 0800 03/07/16 1000  BP: 112/84 103/73 107/71 112/77  Pulse: 71 (!) 54 61 81  Resp: 16 16 16 16   Temp:   98.4 F (36.9 C) 98.1 F (36.7 C)  TempSrc:   Oral Oral  SpO2: 98% 98% 95% 99%  Weight:      Height:        Intake/Output Summary (Last 24 hours) at 03/07/16 1707 Last data filed at 03/07/16 0500  Gross per 24 hour  Intake            60.93 ml  Output                0 ml  Net            60.93 ml   Filed  Weights   03/06/16 1503 03/06/16 2124 03/06/16 2130  Weight: 67.6 kg (149 lb) 69.4 kg (153 lb 1.6 oz) 69.7 kg (153 lb 9.6 oz)    Examination:  General exam: Appears calm and comfortable  Respiratory system: Clear to auscultation. Respiratory effort normal. Cardiovascular system: S1 & S2 heard, RRR. No JVD, murmurs, rubs, gallops or clicks. No pedal edema. Gastrointestinal system: Abdomen is nondistended, soft and nontender. No organomegaly or masses felt. Normal bowel sounds heard. Central nervous system: Alert and oriented. No focal neurological deficits. Extremities: Symmetric 5 x 5 power. Skin: No rashes, lesions or ulcers Psychiatry: Judgement and insight appear normal. Mood & affect appropriate.   Data Reviewed: I have personally reviewed following labs and imaging studies  CBC:  Recent Labs Lab 03/06/16 1503 03/07/16 0633  WBC 5.9 4.7  NEUTROABS 3.3  --   HGB 13.5 13.0  HCT 41.4 39.2  MCV 91.6 91.4  PLT 221 209   Basic Metabolic Panel:  Recent Labs Lab 03/06/16 1503  NA 137  K 3.9  CL 104  CO2 25  GLUCOSE 91  BUN 11  CREATININE 0.86  CALCIUM 9.3   GFR: Estimated Creatinine Clearance: 82 mL/min (by C-G formula based on SCr  of 0.86 mg/dL). Liver Function Tests:  Recent Labs Lab 03/06/16 1503  AST 21  ALT 13*  ALKPHOS 36*  BILITOT 0.6  PROT 7.1  ALBUMIN 4.3   No results for input(s): LIPASE, AMYLASE in the last 168 hours. No results for input(s): AMMONIA in the last 168 hours. Coagulation Profile:  Recent Labs Lab 03/06/16 1503  INR 0.94   Cardiac Enzymes:  Recent Labs Lab 03/06/16 2259  TROPONINI <0.03   BNP (last 3 results) No results for input(s): PROBNP in the last 8760 hours. HbA1C: No results for input(s): HGBA1C in the last 72 hours. CBG: No results for input(s): GLUCAP in the last 168 hours. Lipid Profile:  Recent Labs  03/07/16 0633  CHOL 163  HDL 78  LDLCALC 76  TRIG 45  CHOLHDL 2.1   Thyroid Function Tests: No  results for input(s): TSH, T4TOTAL, FREET4, T3FREE, THYROIDAB in the last 72 hours. Anemia Panel: No results for input(s): VITAMINB12, FOLATE, FERRITIN, TIBC, IRON, RETICCTPCT in the last 72 hours. Sepsis Labs: No results for input(s): PROCALCITON, LATICACIDVEN in the last 168 hours.  No results found for this or any previous visit (from the past 240 hour(s)).     Radiology Studies: Ct Angio Head W Or Wo Contrast  Result Date: 03/06/2016 CLINICAL DATA:  Slurred speech for 1 minutes, LEFT arm weakness for 2 hours. Assess transient ischemic attack. EXAM: CT ANGIOGRAPHY HEAD AND NECK TECHNIQUE: Multidetector CT imaging of the head and neck was performed using the standard protocol during bolus administration of intravenous contrast. Multiplanar CT image reconstructions and MIPs were obtained to evaluate the vascular anatomy. Carotid stenosis measurements (when applicable) are obtained utilizing NASCET criteria, using the distal internal carotid diameter as the denominator. CONTRAST:  50 cc Isovue 370 COMPARISON:  None. FINDINGS: CT HEAD FINDINGS BRAIN: The ventricles and sulci are normal. No intraparenchymal hemorrhage, mass effect nor midline shift. No acute large vascular territory infarcts. No abnormal extra-axial fluid collections. Basal cisterns are patent. VASCULAR: Unremarkable. SKULL/SOFT TISSUES: No skull fracture. No significant soft tissue swelling. ORBITS/SINUSES: The included ocular globes and orbital contents are normal.The mastoid aircells and included paranasal sinuses are well-aerated. OTHER: None. CTA NECK AORTIC ARCH: Normal appearance of the thoracic arch, normal branch pattern. The origins of the innominate, left Common carotid artery and subclavian artery are widely patent. RIGHT CAROTID SYSTEM: Common carotid artery is widely patent, coursing in a straight line fashion. Normal appearance of the carotid bifurcation without hemodynamically significant stenosis by NASCET criteria.  Normal appearance of the included internal carotid artery. LEFT CAROTID SYSTEM: Common carotid artery is widely patent, coursing in a straight line fashion. Normal appearance of the carotid bifurcation without hemodynamically significant stenosis by NASCET criteria. Normal appearance of the included internal carotid artery. VERTEBRAL ARTERIES:Codominant vertebral artery's. 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 No intimal hematoma. No flow limiting stenosis. SKELETON: No acute osseous process though bone windows have not been submitted. Mild degenerative change of the cervical spine without significant neural foraminal narrowing. OTHER NECK: Soft tissues of the neck are non-acute though, not tailored for evaluation. Effaced LEFT piriform sinus, normal variant. CTA HEAD ANTERIOR CIRCULATION: Normal appearance of the cervical internal carotid arteries, petrous, cavernous and supra clinoid internal carotid arteries. Widely patent anterior communicating artery. Normal appearance of the anterior and middle cerebral arteries. No large vessel occlusion, hemodynamically significant stenosis, dissection, luminal irregularity, contrast extravasation or aneurysm. POSTERIOR CIRCULATION: Normal appearance of the vertebral arteries, vertebrobasilar junction  and basilar artery, as well as main branch vessels. Normal appearance of the posterior cerebral arteries. No large vessel occlusion, hemodynamically significant stenosis, dissection, luminal irregularity, contrast extravasation or aneurysm. VENOUS SINUSES: Major dural venous sinuses are patent though not tailored for evaluation on this angiographic examination. ANATOMIC VARIANTS: None. DELAYED PHASE: No abnormal intracranial enhancement. IMPRESSION: CT HEAD:  Negative. 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 No hemodynamically significant stenosis. CTA HEAD:  Negative. Electronically  Signed   By: Awilda Metroourtnay  Bloomer M.D.   On: 03/06/2016 20:04   Dg Chest 2 View  Result Date: 03/07/2016 CLINICAL DATA:  TIA 2 days ago with persistent weakness. History of asthma, carotid artery dissection. EXAM: CHEST  2 VIEW COMPARISON:  None. FINDINGS: The lungs arm mildly hyperinflated. There is no focal infiltrate. There is no pleural effusion or pneumothorax. The heart and pulmonary vascularity are normal. The mediastinum is normal in width. There is calcification in the wall of the aortic arch. The bony thorax is unremarkable. IMPRESSION: Mild hyperinflation consistent with known reactive airway disease. No acute cardiopulmonary abnormality. Thoracic aortic atherosclerosis. Electronically Signed   By: David  SwazilandJordan M.D.   On: 03/07/2016 07:45   Ct Angio Neck W And/or Wo Contrast  Result Date: 03/06/2016 CLINICAL DATA:  Slurred speech for 1 minutes, LEFT arm weakness for 2 hours. Assess transient ischemic attack. EXAM: CT ANGIOGRAPHY HEAD AND NECK TECHNIQUE: Multidetector CT imaging of the head and neck was performed using the standard protocol during bolus administration of intravenous contrast. Multiplanar CT image reconstructions and MIPs were obtained to evaluate the vascular anatomy. Carotid stenosis measurements (when applicable) are obtained utilizing NASCET criteria, using the distal internal carotid diameter as the denominator. CONTRAST:  50 cc Isovue 370 COMPARISON:  None. FINDINGS: CT HEAD FINDINGS BRAIN: The ventricles and sulci are normal. No intraparenchymal hemorrhage, mass effect nor midline shift. No acute large vascular territory infarcts. No abnormal extra-axial fluid collections. Basal cisterns are patent. VASCULAR: Unremarkable. SKULL/SOFT TISSUES: No skull fracture. No significant soft tissue swelling. ORBITS/SINUSES: The included ocular globes and orbital contents are normal.The mastoid aircells and included paranasal sinuses are well-aerated. OTHER: None. CTA NECK AORTIC ARCH:  Normal appearance of the thoracic arch, normal branch pattern. The origins of the innominate, left Common carotid artery and subclavian artery are widely patent. RIGHT CAROTID SYSTEM: Common carotid artery is widely patent, coursing in a straight line fashion. Normal appearance of the carotid bifurcation without hemodynamically significant stenosis by NASCET criteria. Normal appearance of the included internal carotid artery. LEFT CAROTID SYSTEM: Common carotid artery is widely patent, coursing in a straight line fashion. Normal appearance of the carotid bifurcation without hemodynamically significant stenosis by NASCET criteria. Normal appearance of the included internal carotid artery. VERTEBRAL ARTERIES:Codominant vertebral artery's. 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 No intimal hematoma. No flow limiting stenosis. SKELETON: No acute osseous process though bone windows have not been submitted. Mild degenerative change of the cervical spine without significant neural foraminal narrowing. OTHER NECK: Soft tissues of the neck are non-acute though, not tailored for evaluation. Effaced LEFT piriform sinus, normal variant. CTA HEAD ANTERIOR CIRCULATION: Normal appearance of the cervical internal carotid arteries, petrous, cavernous and supra clinoid internal carotid arteries. Widely patent anterior communicating artery. Normal appearance of the anterior and middle cerebral arteries. No large vessel occlusion, hemodynamically significant stenosis, dissection, luminal irregularity, contrast extravasation or aneurysm. POSTERIOR CIRCULATION: Normal appearance of the vertebral arteries, vertebrobasilar  junction and basilar artery, as well as main branch vessels. Normal appearance of the posterior cerebral arteries. No large vessel occlusion, hemodynamically significant stenosis, dissection, luminal irregularity, contrast extravasation or aneurysm. VENOUS SINUSES: Major  dural venous sinuses are patent though not tailored for evaluation on this angiographic examination. ANATOMIC VARIANTS: None. DELAYED PHASE: No abnormal intracranial enhancement. IMPRESSION: CT HEAD:  Negative. 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 No hemodynamically significant stenosis. CTA HEAD:  Negative. Electronically Signed   By: Awilda Metro M.D.   On: 03/06/2016 20:04   Mr Brain Wo Contrast  Result Date: 03/07/2016 CLINICAL DATA:  Initial evaluation for acute slurred speech. Left-sided numbness and weakness. EXAM: MRI HEAD WITHOUT CONTRAST MRA HEAD WITHOUT CONTRAST TECHNIQUE: Multiplanar, multiecho pulse sequences of the brain and surrounding structures were obtained without intravenous contrast. Angiographic images of the head were obtained using MRA technique without contrast. COMPARISON:  Prior CTA from earlier the same day. FINDINGS: MRI HEAD FINDINGS Brain: Cerebral volume within normal limits for age. No focal parenchymal signal abnormality identified. No abnormal foci of restricted diffusion to suggest acute or subacute ischemia. Gray-white matter differentiation well maintained. No acute or chronic intracranial hemorrhage. No encephalomalacia to suggest chronic infarction. No mass lesion, midline shift, or mass effect. No hydrocephalus. No extra-axial fluid collection. Major dural sinuses are grossly patent. Pituitary gland mildly prominent but within normal limits for gender and age. Suprasellar region normal. Midline structures intact. Vascular: Major intracranial vascular flow voids are maintained. Skull and upper cervical spine: Craniocervical junction normal. Visualized upper cervical spine unremarkable. Bone marrow signal intensity within normal limits. No scalp soft tissue abnormality. Sinuses/Orbits: Globes and orbital soft tissues within normal limits. Mild scattered mucosal thickening within the ethmoidal air cells. Paranasal sinuses  are otherwise clear. No mastoid effusion. Inner ear structures normal. MRA HEAD FINDINGS ANTERIOR CIRCULATION: Distal cervical segments of the internal carotid arteries are widely patent with antegrade flow. Petrous, cavernous, and supraclinoid segments widely patent without stenosis. A1 segments patent. Anterior communicating artery normal. Anterior cerebral arteries widely patent. M1 segments widely patent without stenosis or occlusion. MCA bifurcations normal. Distal MCA branches well opacified and symmetric. POSTERIOR CIRCULATION: Vertebral arteries patent to the vertebrobasilar junction. Posterior inferior cerebral arteries patent proximally. Basilar artery widely patent. Superior cerebellar and posterior cerebral arteries widely patent bilaterally. No aneurysm or vascular malformation. IMPRESSION: MRI HEAD IMPRESSION: Normal MRI of the brain. MRA HEAD IMPRESSION: Normal intracranial MRA. Electronically Signed   By: Rise Mu M.D.   On: 03/07/2016 01:26   Mr Maxine Glenn Head/brain KG Cm  Result Date: 03/07/2016 CLINICAL DATA:  Initial evaluation for acute slurred speech. Left-sided numbness and weakness. EXAM: MRI HEAD WITHOUT CONTRAST MRA HEAD WITHOUT CONTRAST TECHNIQUE: Multiplanar, multiecho pulse sequences of the brain and surrounding structures were obtained without intravenous contrast. Angiographic images of the head were obtained using MRA technique without contrast. COMPARISON:  Prior CTA from earlier the same day. FINDINGS: MRI HEAD FINDINGS Brain: Cerebral volume within normal limits for age. No focal parenchymal signal abnormality identified. No abnormal foci of restricted diffusion to suggest acute or subacute ischemia. Gray-white matter differentiation well maintained. No acute or chronic intracranial hemorrhage. No encephalomalacia to suggest chronic infarction. No mass lesion, midline shift, or mass effect. No hydrocephalus. No extra-axial fluid collection. Major dural sinuses are  grossly patent. Pituitary gland mildly prominent but within normal limits for gender and age. Suprasellar region normal. Midline structures intact. Vascular: Major intracranial vascular flow voids are  maintained. Skull and upper cervical spine: Craniocervical junction normal. Visualized upper cervical spine unremarkable. Bone marrow signal intensity within normal limits. No scalp soft tissue abnormality. Sinuses/Orbits: Globes and orbital soft tissues within normal limits. Mild scattered mucosal thickening within the ethmoidal air cells. Paranasal sinuses are otherwise clear. No mastoid effusion. Inner ear structures normal. MRA HEAD FINDINGS ANTERIOR CIRCULATION: Distal cervical segments of the internal carotid arteries are widely patent with antegrade flow. Petrous, cavernous, and supraclinoid segments widely patent without stenosis. A1 segments patent. Anterior communicating artery normal. Anterior cerebral arteries widely patent. M1 segments widely patent without stenosis or occlusion. MCA bifurcations normal. Distal MCA branches well opacified and symmetric. POSTERIOR CIRCULATION: Vertebral arteries patent to the vertebrobasilar junction. Posterior inferior cerebral arteries patent proximally. Basilar artery widely patent. Superior cerebellar and posterior cerebral arteries widely patent bilaterally. No aneurysm or vascular malformation. IMPRESSION: MRI HEAD IMPRESSION: Normal MRI of the brain. MRA HEAD IMPRESSION: Normal intracranial MRA. Electronically Signed   By: Rise MuBenjamin  McClintock M.D.   On: 03/07/2016 01:26      Scheduled Meds: . aspirin  81 mg Oral Daily  . clopidogrel  75 mg Oral Daily   Continuous Infusions:   LOS: 0 days    Time spent: 40 minutes   Noralee StainJennifer Kerolos Nehme, DO [facility] [email or web address] Password [email or web address] 03/07/2016, 5:07 PM

## 2016-03-07 NOTE — Progress Notes (Signed)
   Pt is scheduled for TEE 03/08/16 for TIA with Dr. Delton SeeNelson. Indication for procedure as well as procedure details and risk were explained to patient and questions answered. Pt agrees to procedure and gave verbal consent. RN to obtain written consent. TEE orders placed. Pt will be NPO at midnight.   Janice Myers 03/07/2016

## 2016-03-08 ENCOUNTER — Observation Stay (HOSPITAL_BASED_OUTPATIENT_CLINIC_OR_DEPARTMENT_OTHER): Payer: 59

## 2016-03-08 ENCOUNTER — Encounter (HOSPITAL_COMMUNITY): Payer: Self-pay | Admitting: *Deleted

## 2016-03-08 ENCOUNTER — Encounter (HOSPITAL_COMMUNITY)
Admission: EM | Disposition: A | Discharge status: Home / Self Care | Payer: Self-pay | Source: Home / Self Care | Attending: Emergency Medicine

## 2016-03-08 DIAGNOSIS — Q211 Atrial septal defect: Secondary | ICD-10-CM | POA: Diagnosis not present

## 2016-03-08 DIAGNOSIS — G451 Carotid artery syndrome (hemispheric): Secondary | ICD-10-CM

## 2016-03-08 DIAGNOSIS — I7774 Dissection of vertebral artery: Secondary | ICD-10-CM | POA: Diagnosis not present

## 2016-03-08 DIAGNOSIS — G459 Transient cerebral ischemic attack, unspecified: Secondary | ICD-10-CM

## 2016-03-08 HISTORY — PX: TEE WITHOUT CARDIOVERSION: SHX5443

## 2016-03-08 LAB — BASIC METABOLIC PANEL
Anion gap: 6 (ref 5–15)
BUN: 11 mg/dL (ref 6–20)
CHLORIDE: 106 mmol/L (ref 101–111)
CO2: 26 mmol/L (ref 22–32)
CREATININE: 0.84 mg/dL (ref 0.44–1.00)
Calcium: 9 mg/dL (ref 8.9–10.3)
GFR calc non Af Amer: >60 mL/min (ref >60)
Glucose, Bld: 102 mg/dL — ABNORMAL HIGH (ref 65–99)
POTASSIUM: 4 mmol/L (ref 3.5–5.1)
SODIUM: 138 mmol/L (ref 135–145)

## 2016-03-08 LAB — CBC
HCT: 39.6 % (ref 36.0–46.0)
Hemoglobin: 13 g/dL (ref 12.0–15.0)
MCH: 30.7 pg (ref 26.0–34.0)
MCHC: 32.8 g/dL (ref 30.0–36.0)
MCV: 93.4 fL (ref 78.0–100.0)
Platelets: 206 10*3/uL (ref 150–400)
RBC: 4.24 MIL/uL (ref 3.87–5.11)
RDW: 13.7 % (ref 11.5–15.5)
WBC: 5.7 10*3/uL (ref 4.0–10.5)

## 2016-03-08 LAB — PROTIME-INR
INR: 0.97
Prothrombin Time: 12.9 seconds (ref 11.4–15.2)

## 2016-03-08 LAB — HEMOGLOBIN A1C
Hgb A1c MFr Bld: 5.3 % (ref 4.8–5.6)
MEAN PLASMA GLUCOSE: 105 mg/dL

## 2016-03-08 SURGERY — ECHOCARDIOGRAM, TRANSESOPHAGEAL
Anesthesia: Moderate Sedation

## 2016-03-08 MED ORDER — FENTANYL CITRATE (PF) 100 MCG/2ML IJ SOLN
INTRAMUSCULAR | Status: AC
  Filled 2016-03-08: qty 2

## 2016-03-08 MED ORDER — FENTANYL CITRATE (PF) 100 MCG/2ML IJ SOLN
INTRAMUSCULAR | Status: DC | PRN
  Administered 2016-03-08 (×3): 25 ug via INTRAVENOUS

## 2016-03-08 MED ORDER — MIDAZOLAM HCL 5 MG/ML IJ SOLN
INTRAMUSCULAR | Status: AC
  Filled 2016-03-08: qty 2

## 2016-03-08 MED ORDER — ATORVASTATIN CALCIUM 10 MG PO TABS: 10.0000 mg | ORAL_TABLET | Freq: Every day | ORAL | 0 refills | Status: DC

## 2016-03-08 MED ORDER — BUTAMBEN-TETRACAINE-BENZOCAINE 2-2-14 % EX AERO
INHALATION_SPRAY | CUTANEOUS | Status: DC | PRN
  Administered 2016-03-08: 2 via TOPICAL

## 2016-03-08 MED ORDER — MIDAZOLAM HCL 10 MG/2ML IJ SOLN
INTRAMUSCULAR | Status: DC | PRN
  Administered 2016-03-08: 1 mg via INTRAVENOUS
  Administered 2016-03-08: 2 mg via INTRAVENOUS
  Administered 2016-03-08: 1 mg via INTRAVENOUS
  Administered 2016-03-08: 2 mg via INTRAVENOUS

## 2016-03-08 MED ORDER — SODIUM CHLORIDE 0.9 % IV SOLN
INTRAVENOUS | Status: DC
  Administered 2016-03-08: 14:00:00 via INTRAVENOUS

## 2016-03-08 MED ORDER — FENTANYL CITRATE (PF) 100 MCG/2ML IJ SOLN
12.5000 ug | Freq: Once | INTRAMUSCULAR | Status: AC
  Administered 2016-03-08: 12.5 ug via INTRAVENOUS

## 2016-03-08 NOTE — Progress Notes (Signed)
STROKE TEAM PROGRESS NOTE   SUBJECTIVE (INTERVAL HISTORY) No family at bedside. Had TEE today confirmed PFO. No acute event overnight. OK to discharge from neuro standpoint. Will refer to Dr. Excell Seltzerooper for consideration of PFO closure.   OBJECTIVE Temp:  [97.9 F (36.6 C)-98.9 F (37.2 C)] 97.9 F (36.6 C) (12/22 1540) Pulse Rate:  [57-67] 57 (12/22 1600) Cardiac Rhythm: Sinus bradycardia (12/22 0700) Resp:  [10-19] 14 (12/22 1600) BP: (97-139)/(54-86) 97/54 (12/22 1600) SpO2:  [94 %-98 %] 95 % (12/22 1600) Weight:  [153 lb 9.6 oz (69.7 kg)] 153 lb 9.6 oz (69.7 kg) (12/22 1410)  CBC:   Recent Labs Lab 03/06/16 1503 03/07/16 0633 03/08/16 0257  WBC 5.9 4.7 5.7  NEUTROABS 3.3  --   --   HGB 13.5 13.0 13.0  HCT 41.4 39.2 39.6  MCV 91.6 91.4 93.4  PLT 221 209 206    Basic Metabolic Panel:   Recent Labs Lab 03/06/16 1503 03/08/16 0257  NA 137 138  K 3.9 4.0  CL 104 106  CO2 25 26  GLUCOSE 91 102*  BUN 11 11  CREATININE 0.86 0.84  CALCIUM 9.3 9.0    Lipid Panel:     Component Value Date/Time   CHOL 163 03/07/2016 0633   TRIG 45 03/07/2016 0633   HDL 78 03/07/2016 0633   CHOLHDL 2.1 03/07/2016 0633   VLDL 9 03/07/2016 0633   LDLCALC 76 03/07/2016 0633   HgbA1c:  Lab Results  Component Value Date   HGBA1C 5.3 03/07/2016    IMAGING I have personally reviewed the radiological images below and agree with the radiology interpretations.  Dg Chest 2 View 03/07/2016 Mild hyperinflation consistent with known reactive airway disease. No acute cardiopulmonary abnormality. Thoracic aortic atherosclerosis.   CT HEAD 03/06/2016 Negative.   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 No hemodynamically significant stenosis.   CTA HEAD 03/06/2016 Negative.   MRI HEAD  03/07/2016 Normal MRI of the brain.   MRA HEAD  03/07/2016 Normal intracranial MRA.   Lower Ext. Venous Duplex Completed. Negative  for deep and superficial vein thrombosis in both legs.   TCD bubble study - moderate PFO  TTE - Left ventricle: The cavity size was normal. Systolic function was   normal. The estimated ejection fraction was in the range of 60%   to 65%. Wall motion was normal; there were no regional wall   motion abnormalities. Doppler parameters are consistent with   abnormal left ventricular relaxation (grade 1 diastolic   dysfunction). - Aortic valve: Transvalvular velocity was within the normal range.   There was no stenosis. There was no regurgitation. - Mitral valve: There was trivial regurgitation. - Right ventricle: The cavity size was normal. Wall thickness was   normal. Systolic function was normal. - Tricuspid valve: There was trivial regurgitation.  TEE Left ventricle: Systolic function was normal. The estimated   ejection fraction was in the range of 60% to 65%. Wall motion was   normal; there were no regional wall motion abnormalities. - Aortic valve: No evidence of vegetation. - Left atrium: No evidence of thrombus in the atrial cavity or   appendage. No evidence of thrombus in the atrial cavity or   appendage. - Right atrium: No evidence of thrombus in the atrial cavity or   appendage. No evidence of thrombus in the atrial cavity or   appendage. - Atrial septum: There was a patent foramen ovale. - Tricuspid valve:  No evidence of vegetation. - Pulmonic valve: No evidence of vegetation. Impressions: - No intracardiac source of embolism.   Positive bubble study consistent with PFO.   PHYSICAL EXAM  Temp:  [97.9 F (36.6 C)-98.9 F (37.2 C)] 97.9 F (36.6 C) (12/22 1540) Pulse Rate:  [57-67] 57 (12/22 1600) Resp:  [10-19] 14 (12/22 1600) BP: (97-139)/(54-86) 97/54 (12/22 1600) SpO2:  [94 %-98 %] 95 % (12/22 1600) Weight:  [153 lb 9.6 oz (69.7 kg)] 153 lb 9.6 oz (69.7 kg) (12/22 1410)  General - Well nourished, well developed, in no apparent distress.  Ophthalmologic -  Sharp disc margins OU.  Cardiovascular - Regular rate and rhythm.  Mental Status -  Level of arousal and orientation to time, place, and person were intact. Language including expression, naming, repetition, comprehension was assessed and found intact. Attention span and concentration were normal. Recent and remote memory were intact. Fund of Knowledge was assessed and was intact.  Cranial Nerves II - XII - II - Visual field intact OU. III, IV, VI - Extraocular movements intact. V - Facial sensation intact bilaterally. VII - Facial movement intact bilaterally. VIII - Hearing & vestibular intact bilaterally. X - Palate elevates symmetrically. XI - Chin turning & shoulder shrug intact bilaterally. XII - Tongue protrusion intact.  Motor Strength - The patient's strength was normal in all extremities and pronator drift was absent.  Bulk was normal and fasciculations were absent.   Motor Tone - Muscle tone was assessed at the neck and appendages and was normal.  Reflexes - The patient's reflexes were 1+ in all extremities and she had no pathological reflexes.  Sensory - Light touch, temperature/pinprick, vibration and proprioception, and Romberg testing were assessed and were symmetrical.    Coordination - The patient had normal movements in the hands and feet with no ataxia or dysmetria.  Tremor was absent.  Gait and Station - The patient's transfers, posture, gait, station, and turns were observed as normal.   ASSESSMENT/PLAN Janice Myers is a 45 y.o. female with history of asthma and seasonal allergies presenting with transient left arm weakness and slurred speech. She did not receive IV t-PA due to delay in arrival, resolved symptoms.   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 ROPE score high. Will refer to Dr. Excell Seltzer for evaluation.  avoid aggressive workout and abrupt neck turning or stretching.  LDL 76  HgbA1c 5.3  IV heparin for VTE prophylaxis  Diet Heart Room service appropriate? Yes; Fluid consistency: Thin  No antithrombotic prior to admission, now on heparin IV. Continue ASA and plavix for 3-6 months. Will repeat CTA neck in 3 months to guide the duration of therapy  Therapy recommendations: no needs   Disposition: home likely   Hyperlipidemia  Home meds:  No statin  LDL 76  Put on low dose lipitor  Continue lipitor at discharge.   Other Stroke Risk Factors  ETOH use,  advised to drink no more than 1 drink(s) a day  Family hx stroke (mother)  Other Active Problems  Asthma   Hospital day # 0  Neurology will sign off. Please call with questions. [facility]. Thanks for the consult.   Marvel PlanJindong Lashundra Shiveley, MD PhD Stroke Neurology 03/08/2016 6:33 PM    To contact Stroke Continuity provider, please refer to [email or web address]. After hours, contact General Neurology

## 2016-03-08 NOTE — Progress Notes (Signed)
Discharge orders received.  Discharge instructions and follow-up appointments reviewed with the patient.  VSS upon discharge.  IV removed and education complete.  Transported out via wheelchair.   Jesusa Stenerson M, RN 

## 2016-03-08 NOTE — CV Procedure (Signed)
    Transesophageal Echocardiogram Note  Tressie EllisMaria Christmas [id number] 06/14/1970  Procedure: Transesophageal Echocardiogram Indications: TIA  Procedure Details Consent: Obtained Time Out: Verified patient identification, verified procedure, site/side was marked, verified correct patient position, special equipment/implants available, Radiology Safety Procedures followed,  medications/allergies/relevent history reviewed, required imaging and test results available.  Performed  Medications: Fentanyl: 50 mcg  Versed: 7 mg  No intracardiac source of embolism. Positive bubble study consistent with PFO.   Complications: No apparent complications Patient did tolerate procedure well.  Tobias AlexanderKatarina Inaaya Vellucci, MD, [facility] 03/08/2016, 4:00 PM

## 2016-03-08 NOTE — Progress Notes (Signed)
  Echocardiogram Echocardiogram Transesophageal has been performed.  [name], Janice Myers 03/08/2016, 4:01 PM

## 2016-03-08 NOTE — H&P (View-Only) (Signed)
   Pt is scheduled for TEE 03/08/16 for TIA with Dr. Nelson. Indication for procedure as well as procedure details and risk were explained to patient and questions answered. Pt agrees to procedure and gave verbal consent. RN to obtain written consent. TEE orders placed. Pt will be NPO at midnight.   Janice Myers 03/07/2016  

## 2016-03-08 NOTE — Interval H&P Note (Signed)
History and Physical Interval Note:  03/08/2016 2:13 PM  Janice EllisMaria Lhommedieu  has presented today for surgery, with the diagnosis of STROKE  The various methods of treatment have been discussed with the patient and family. After consideration of risks, benefits and other options for treatment, the patient has consented to  Procedure(s): TRANSESOPHAGEAL ECHOCARDIOGRAM (TEE) (N/A) as a surgical intervention .  The patient's history has been reviewed, patient examined, no change in status, stable for surgery.  I have reviewed the patient's chart and labs.  Questions were answered to the patient's satisfaction.     Tobias AlexanderKatarina Sabriya Yono

## 2016-03-13 ENCOUNTER — Telehealth: Payer: Self-pay | Admitting: Neurology

## 2016-03-13 NOTE — Telephone Encounter (Signed)
appt has been scheduled for pt to see Dr Roda ShuttersXu 05/22/15, 1st available. Notes say 6 weeks, pt said he wanted to see her in 1mth. Pt would like Dr Roda ShuttersXu to be aware of the date she will see him again. She would like a return call to confirm it is ok. Pt is aware Dr Roda ShuttersXu is out of the office this week.

## 2016-03-13 NOTE — Telephone Encounter (Signed)
Dr.Xu's referral says to follow up with him in 2 months. I think the March 6th 2018 appt will be acceptable.  I called pt to discuss. No answer, left a message asking her to call us back. If she calls back, please relay this message to her. I will also send this message to WilliamsKatrina, Charity fundraiserN.

## 2016-03-14 NOTE — Telephone Encounter (Signed)
I called pt again to discuss. No answer, left a message asking her to call me back. 

## 2016-03-14 NOTE — Telephone Encounter (Signed)
Pt returned my call. I advised her that Dr. Warren DanesXu's referral says for pt to return in 2 months, and 05/21/2016 is about 2 months from now. Pt says that she would really like to be seen sooner. I advised her that I do not see any sooner appts right now, but I will ask Katrina, RN to keep looking for sooner appts next week. Pt verbalized understanding appreciation and understanding.

## 2016-03-20 ENCOUNTER — Ambulatory Visit (INDEPENDENT_AMBULATORY_CARE_PROVIDER_SITE_OTHER): Payer: 59 | Admitting: Family Medicine

## 2016-03-20 VITALS — BP 102/70 | HR 70 | Temp 97.9°F | Ht 67.0 in | Wt 153.2 lb

## 2016-03-20 DIAGNOSIS — G459 Transient cerebral ischemic attack, unspecified: Secondary | ICD-10-CM

## 2016-03-20 DIAGNOSIS — I7771 Dissection of carotid artery: Secondary | ICD-10-CM | POA: Diagnosis not present

## 2016-03-20 DIAGNOSIS — S61209A Unspecified open wound of unspecified finger without damage to nail, initial encounter: Secondary | ICD-10-CM | POA: Diagnosis not present

## 2016-03-20 MED ORDER — ATORVASTATIN CALCIUM 10 MG PO TABS: 10.0000 mg | ORAL_TABLET | Freq: Every day | ORAL | 3 refills | Status: DC

## 2016-03-20 NOTE — Progress Notes (Signed)
[facility] [address], [address] [place], [place] [address] [phone] [phone]  Date:  03/20/2016   Name:  Janice EllisMaria Myers   DOB:  06/16/1970   MRN:  [id number]  PCP:  Abbe AmsterdamOPLAND,JESSICA, MD    Chief Complaint: Hospitalization Follow-up (Pt here)   History of Present Illness:  Janice EllisMaria Myers is a 46 y.o. very pleasant female patient who presents with the following:  She was hospitalized from 12/20 to 12/22 with a TIA and small dissection of her right carotid, also found to have a PFO.  Per her report, the inpt team suspected that her dissection and TIA was caused by her recent participation in a very vigorous exercise program.   Her appt with Dr. Roda ShuttersXu is not until March- she wonders if she should be seen sooner.  While in the hospital Drr. Xu had mentioned seeing her in the office in about one month.    Her cholesterol was quite good but they have her on lipitor for now.  She is now sure if she needs to continue to take this long term.  She is seeing Cardiology this week to discuss the PFO  She is on augmentin right now for a sinus infection Otherwise she is feeling back to normal and notes no sequelae of recent TIA.    Also yesterday she cut her left pinky finger with a potato peeler- she bandaged it but was not sure if it needs any further treatment.   Her tetanus is UTD (2014).   Patient Active Problem List   Diagnosis Date Noted  . Dissection of vertebral artery (HCC)   . PFO (patent foramen ovale)   . TIA (transient ischemic attack) 03/06/2016  . Asthma 03/06/2016  . 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 Asthma   . Seasonal allergies     Past Surgical History:  Procedure Laterality Date  . ANTERIOR CRUCIATE LIGAMENT REPAIR  2013  . KELOID EXCISION  1992  . TEE WITHOUT CARDIOVERSION N/A 03/08/2016   Procedure: TRANSESOPHAGEAL  ECHOCARDIOGRAM (TEE);  Surgeon: Lars MassonKatarina H Nelson, MD;  [facility];  Service: Cardiovascular;  Laterality: N/A;    Social History  Substance Use Topics  . Smoking status: Never Smoker  . Smokeless tobacco: Never Used  . Alcohol use Yes     Comment: Seldom    Family History  Problem Relation Age of Onset  . Stroke Mother   . Parkinson's disease Mother   . Hyperlipidemia Father   . Alzheimer's disease Maternal Grandmother   . Prostate cancer Maternal Grandfather   . Heart disease Paternal Grandfather   . Epilepsy Other     Uncle    No Known Allergies  Medication list has been reviewed and updated.  Current Outpatient Prescriptions on File Prior to Visit  Medication Sig Dispense Refill  . albuterol (PROVENTIL HFA;VENTOLIN HFA) 108 (90 Base) MCG/ACT inhaler Inhale 1 puff into the lungs every 6 (six) hours as needed for wheezing or shortness of breath. 18 g 6  . aspirin 81 MG chewable tablet Chew 1 tablet (81 mg total) by mouth daily. 90 tablet 0  . atorvastatin (LIPITOR) 10 MG tablet Take 1 tablet (10 mg total) by mouth daily at 6 PM. 30 tablet 0  . clopidogrel (PLAVIX) 75 MG tablet Take 1 tablet (75 mg total) by mouth daily. 90 tablet 0   No current  facility-administered medications on file prior to visit.     Review of Systems:  As per HPI- otherwise negative.   Physical Examination: Vitals:   03/20/16 1142  BP: 102/70  Pulse: 70  Temp: 97.9 F (36.6 C)   Vitals:   03/20/16 1142  Weight: 153 lb 3.2 oz (69.5 kg)  Height: 5\' 7"  (1.702 m)   Body mass index is 23.99 kg/m. Ideal Body Weight: Weight in (lb) to have BMI = 25: 159.3  GEN: WDWN, NAD, Non-toxic, A & O x 3, looks well HEENT: Atraumatic, Normocephalic. Neck supple. No masses, No LAD. Ears and Nose: No external deformity. CV: RRR, No M/G/R. No JVD. No thrill. No extra heart sounds. PULM: CTA B, no wheezes, crackles, rhonchi. No retractions. No resp. distress. No accessory muscle use. EXTR:  No c/c/e NEURO Normal gait.  Moving all limbs normally, no slurred speech PSYCH: Normally interactive. Conversant. Not depressed or anxious appearing.  Calm demeanor.  Wound on lateal aspect of the pad of the distal left pinky finger.  It is a shallow laceration, approx 5mm in length.  Washed with soap and water and closed with steri strips.    Assessment and Plan: Transient cerebral ischemia, unspecified type  Carotid artery dissection (HCC)  Open wound of finger, initial encounter  Here today to follow-up from recent TIA due to carotid artery dissection- also noted to have PFO fortunately she has no residual sx She is on aspirin, plavix, and lipitor- continue all for the time being Follow-up is arranged with cardiology and I will contact Dr. Roda Shutters about moving her appt up Closed small wound on finger with steri-stips (no suture as wound is minor and a day old), discussed wound care  Signed Abbe Amsterdam, MD

## 2016-03-20 NOTE — Patient Instructions (Addendum)
I will email Dr. Roda ShuttersXu about your next appt.  Keep a bandage on your finger until the edge is reattached Let me know if you need anything or have any concerns in the meantime

## 2016-03-20 NOTE — Progress Notes (Signed)
Pre visit review using our clinic review tool, if applicable. No additional management support is needed unless otherwise documented below in the visit note. 

## 2016-03-21 NOTE — Telephone Encounter (Signed)
Rn receive message from Valene Borserara in phone room that pt can come in at 0430pm on 03/25/2016 to see Dr. Roda ShuttersXu. This is per Dr. Roda ShuttersXu request for pt to be seen sooner. Valene Borserara explain to patient to check in at 0400pm and bring insurance card and co payment.

## 2016-03-21 NOTE — Telephone Encounter (Signed)
LEft vm for patient to call back about being schedule on 03/25/2016 at 0430pm.

## 2016-03-21 NOTE — Telephone Encounter (Signed)
Pt called, she is very concerned that she should be seen sooner that 05/21/16. She said Dr Roda ShuttersXu specifically told her he wanted to see her in 1mth. Please call to touch base with her

## 2016-03-22 ENCOUNTER — Ambulatory Visit (INDEPENDENT_AMBULATORY_CARE_PROVIDER_SITE_OTHER): Payer: 59 | Admitting: Cardiovascular Disease

## 2016-03-22 VITALS — BP 114/74 | HR 72 | Ht 67.0 in | Wt 152.2 lb

## 2016-03-22 DIAGNOSIS — Q211 Atrial septal defect: Secondary | ICD-10-CM | POA: Diagnosis not present

## 2016-03-22 DIAGNOSIS — Q2112 Patent foramen ovale: Secondary | ICD-10-CM

## 2016-03-22 NOTE — Progress Notes (Signed)
Cardiology Office Note Date:  03/22/2016   ID:  Janice EllisMaria Fiorini, DOB 01/05/1971, MRN [id number]  PCP:  Abbe AmsterdamOPLAND,JESSICA, MD  Cardiologist:  Tonny Bollmanooper, Tesia Lybrand, MD    Chief Complaint  Patient presents with  . New Patient (Initial Visit)    PFO consult     History of Present Illness: Janice Myers is a 46 y.o. female who presents for evaluation of PFO closure.   The patient is a healthy 46 year old woman who presented with left arm weakness and slurred speech 03/06/2016. Her speech symptoms only lasted a few minutes. Left arm heaviness and weakness lasted approximately 2 hours.  MRI/MRA of the brain was normal. 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 This finding was not felt to correspond with her TIA symptoms. She underwent a transcranial Doppler study that demonstrated moderate right to left shunting. The TEE demonstrated PFO with a positive microcavitation study at rest suggestive of at least moderate shunt. She is referred for evaluation of transcatheter PFO closure.  The patient has recovered well from her stroke. She denies any residual symptoms. She feels well and specifically denies symptoms of chest pain, heart palpitations, or shortness of breath. She has active with regular exercise and denies any exertional symptoms.   Past Medical History:  Diagnosis Date  . Asthma   . Seasonal allergies     Past Surgical History:  Procedure Laterality Date  . ANTERIOR CRUCIATE LIGAMENT REPAIR  2013  . KELOID EXCISION  1992  . TEE WITHOUT CARDIOVERSION N/A 03/08/2016   Procedure: TRANSESOPHAGEAL ECHOCARDIOGRAM (TEE);  Surgeon: Lars MassonKatarina H Nelson, MD;  [facility];  Service: Cardiovascular;  Laterality: N/A;    Current Outpatient Prescriptions  Medication Sig Dispense Refill  . albuterol (PROVENTIL HFA;VENTOLIN HFA) 108 (90 Base) MCG/ACT inhaler Inhale 1 puff into the lungs every 6 (six) hours as  needed for wheezing or shortness of breath. 18 g 6  . amoxicillin-clavulanate (AUGMENTIN) 875-125 MG tablet Take 1 tablet by mouth 2 (two) times daily.    Marland Kitchen. aspirin 81 MG chewable tablet Chew 1 tablet (81 mg total) by mouth daily. 90 tablet 0  . atorvastatin (LIPITOR) 10 MG tablet Take 1 tablet (10 mg total) by mouth daily at 6 PM. 90 tablet 3  . benzonatate (TESSALON) 200 MG capsule Take 200 mg by mouth 3 (three) times daily as needed for cough.  0  . clopidogrel (PLAVIX) 75 MG tablet Take 1 tablet (75 mg total) by mouth daily. 90 tablet 0   No current facility-administered medications for this visit.     Allergies:   Patient has no known allergies.   Social History:  The patient  reports that she has never smoked. She has never used smokeless tobacco. She reports that she drinks alcohol. She reports that she does not use drugs.   Family History:  The patient's  family history includes Alzheimer's disease in her maternal grandmother; Epilepsy in her other; Heart disease in her paternal grandfather; Hyperlipidemia in her father; Parkinson's disease in her mother; Prostate cancer in her maternal grandfather; Stroke in her mother.    ROS:  Please see the history of present illness.  All other systems are reviewed and negative.    PHYSICAL EXAM: VS:  BP 114/74   Pulse 72   Ht 5\' 7"  (1.702 m)   Wt 152 lb 3.2 oz (69 kg)   LMP 02/16/2016   BMI 23.84 kg/m  , BMI Body mass index  is 23.84 kg/m. GEN: Well nourished, well developed, in no acute distress  HEENT: normal  Neck: no JVD, no masses. No carotid bruits Cardiac: RRR without murmur or gallop                Respiratory:  clear to auscultation bilaterally, normal work of breathing GI: soft, nontender, nondistended, + BS MS: no deformity or atrophy  Ext: no pretibial edema, pedal pulses 2+= bilaterally Skin: warm and dry, no rash Neuro:  Strength and sensation are intact Psych: euthymic mood, full affect  EKG:  EKG is not ordered  today.  Recent Labs: 01/01/2016: TSH 2.38 03/06/2016: ALT 13 03/08/2016: BUN 11; Creatinine, Ser 0.84; Hemoglobin 13.0; Platelets 206; Potassium 4.0; Sodium 138   Lipid Panel     Component Value Date/Time   CHOL 163 03/07/2016 0633   TRIG 45 03/07/2016 0633   HDL 78 03/07/2016 0633   CHOLHDL 2.1 03/07/2016 0633   VLDL 9 03/07/2016 0633   LDLCALC 76 03/07/2016 0633      Wt Readings from Last 3 Encounters:  03/22/16 152 lb 3.2 oz (69 kg)  03/20/16 153 lb 3.2 oz (69.5 kg)  03/08/16 153 lb 9.6 oz (69.7 kg)     Cardiac Studies Reviewed: TEE: Study Conclusions  - Left ventricle: Systolic function was normal. The estimated   ejection fraction was in the range of 60% to 65%. Wall motion was   normal; there were no regional wall motion abnormalities. - Aortic valve: No evidence of vegetation. - Left atrium: No evidence of thrombus in the atrial cavity or   appendage. No evidence of thrombus in the atrial cavity or   appendage. - Right atrium: No evidence of thrombus in the atrial cavity or   appendage. No evidence of thrombus in the atrial cavity or   appendage. - Atrial septum: There was a patent foramen ovale. - Tricuspid valve: No evidence of vegetation. - Pulmonic valve: No evidence of vegetation.  Impressions:  - No intracardiac source of embolism.   Positive bubble study consistent with PFO.  TCD Bubble: Summary:  - Transcranial Doppler Bubble Study was performed at the lab after   taking written informed consent from the patient and explaining   risk/benefits. The right middle cerebral artery was insonated   using a hand held probe. And IV line had been previously inserted   in the left forearm by the RN using aseptic precautions. Agitated   saline injection at rest and after valsalva maneuver.     At rest, after injection it did result in curtain effect of high   intensity transient signals (HITS). With valsalva, injection   again resulted in short  period of curtain effect initially but   was later suppressed by valsalva maneuver. - positive TCD bubble study indicating a moderate sized   intracardiac shunt. Result share with pt at the bedside and   answered her questions.   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  The patient is otherwise healthy with no atherosclerotic risk factors. She did have a vertebral artery dissection that is not felt to correlate with her symptoms. Consideration of PFO closure is recommended by neurology.  I reviewed available clinical trial data with the patient. She understands that multiple recent trials demonstrate improved secondary risk reduction for transcatheter PFO closure compared to antiplatelet therapy in patients initially presenting with cryptogenic stroke. This is  especially true in patient's with a paucity of cardiovascular risk factors who present at a young age.   I reviewed the risks, indications, and alternatives to transcatheter closure with the patient. Specific risks include bleeding, infection, device embolization, stroke, cardiac perforation, tamponade, arrhythmia, MI, and late device erosion. He understands these serious risks occur at low incidence of < 1%.   She is scheduled to see Dr. Roda Shutters  next week and will discuss further with him. She will call back if she decides to schedule the procedure after consulting again with her neurologist.  Current medicines are reviewed with the patient today.  The patient does not have concerns regarding medicines.  Labs/ tests ordered today include:  No orders of the defined types were placed in this encounter.  Enzo Bi, MD  03/22/2016 11:25 AM    [facility] [address] [place], [place], [place]  [address] Phone: [phone]; Fax: [phone]

## 2016-03-22 NOTE — Patient Instructions (Addendum)
Medication Instructions:  Your physician recommends that you continue on your current medications as directed. Please refer to the Current Medication list given to you today.  Labwork: No new orders.   Testing/Procedures: No new orders.   Follow-Up: Please contact the office at [phone] if you would like to proceed with PFO closure.   Any Other Special Instructions Will Be Listed Below (If Applicable).     If you need a refill on your cardiac medications before your next appointment, please call your pharmacy.

## 2016-03-24 ENCOUNTER — Encounter: Payer: Self-pay | Admitting: Cardiovascular Disease

## 2016-03-25 ENCOUNTER — Encounter: Payer: Self-pay | Admitting: Neurology

## 2016-03-25 ENCOUNTER — Ambulatory Visit: Payer: Self-pay | Admitting: Neurology

## 2016-03-25 ENCOUNTER — Ambulatory Visit (INDEPENDENT_AMBULATORY_CARE_PROVIDER_SITE_OTHER): Payer: 59 | Admitting: Neurology

## 2016-03-25 VITALS — BP 112/77 | HR 76 | Ht 67.0 in | Wt 150.8 lb

## 2016-03-25 DIAGNOSIS — I7774 Dissection of vertebral artery: Secondary | ICD-10-CM

## 2016-03-25 DIAGNOSIS — G451 Carotid artery syndrome (hemispheric): Secondary | ICD-10-CM | POA: Diagnosis not present

## 2016-03-25 DIAGNOSIS — Q211 Atrial septal defect: Secondary | ICD-10-CM | POA: Diagnosis not present

## 2016-03-25 DIAGNOSIS — Q2112 Patent foramen ovale: Secondary | ICD-10-CM

## 2016-03-25 NOTE — Patient Instructions (Addendum)
-   continue ASA and plavix and lipitor for stroke prevention  - consider PFO procedure vs. Conservative treatment - follow up with Dr. Excell Seltzerooper - will check again CT angiogram of neck next visit.  - avoid exercise with aggressive neck movement, stretch or turning - Follow up with your primary care physician for stroke risk factor modification. Recommend maintain blood pressure goal <130/80, and lipids with LDL cholesterol goal below 70 mg/dL.  - follow up in 3 months with me.

## 2016-03-25 NOTE — Progress Notes (Signed)
STROKE NEUROLOGY FOLLOW UP NOTE  NAME: Janice Myers DOB: July 15, 1970  REASON FOR VISIT: stroke follow up HISTORY FROM: pt and husband  Today we had the pleasure of seeing Janice Myers in follow-up at our Neurology Clinic. Pt was accompanied by husband.   History Summary Janice Myers is a 46 y.o. female with history of asthma and seasonal allergiesadmitted on 03/06/2016 for 2 episodes of transient left arm weakness and slurred speech. She was speaking to her youngest, suddenly her speech came out garbled. This lasted for about 1 minute and resolved. Llater she developed numbness and weakness in her left arm that she couldn't make a fist.  she held her arms up and noticed that the left arm was drifting down that lasted about 2 hours and resolved. She had associated left neck and shoulder stiffness at that same time which had been intermittent for a week. She was on a new, aggressive work out regimen where she lifts heavy buckets filled with cement, does burpees, runs with sandbags, etc.   Stroke workup showed negative CT and MRI brain, MRA head, CTA head, TTE, and LE venous Doppler. 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 TCD bubble study showed moderate PFO, and TEE confirmed PFO. LDL 76 and A1c 5.3. Her TIA symptoms not consistent with VA dissection, concerning for association with PFO. She was referred to Dr. Excell Seltzer for further evaluation as outpatient. On discharge, she was put on DAPT for 3-6 months as well as low-dose Lipitor. Plan to repeat CTA neck in 3 months.  Interval History During the interval time, the patient has been doing well. No recurrent stroke symptoms. She went to see Dr. Excell Seltzer in last week, still undecided on PFO closure procedure. She has quit aggressive work on at this time. I answered all her questions regarding PFO procedure and recurrent stroke. BP  today 112/70.  REVIEW OF SYSTEMS: Full 14 system review of systems performed and notable only for those listed below and in HPI above, all others are negative:  Constitutional:   Cardiovascular:  Ear/Nose/Throat:   Skin:  Eyes:   Respiratory:   Gastroitestinal:   Genitourinary:  Hematology/Lymphatic:   Endocrine:  Musculoskeletal:   Allergy/Immunology:   Neurological:  Headache Psychiatric:  Sleep:   The following represents the patient's updated allergies and side effects list: No Known Allergies  The neurologically relevant items on the patient's problem list were reviewed on today's visit.  Neurologic Examination  A problem focused neurological exam (12 or more points of the single system neurologic examination, vital signs counts as 1 point, cranial nerves count for 8 points) was performed.  Blood pressure 112/77, pulse 76, height 5\' 7"  (1.702 m), weight 150 lb 12.8 oz (68.4 kg), last menstrual period 02/16/2016.  General - Well nourished, well developed, in no apparent distress.  Ophthalmologic - Sharp disc margins OU.   Cardiovascular - Regular rate and rhythm with no murmur.  Mental Status -  Level of arousal and orientation to time, place, and person were intact. Language including expression, naming, repetition, comprehension was assessed and found intact. Fund of Knowledge was assessed and was intact.  Cranial Nerves II - XII - II - Visual field intact OU. III, IV, VI - Extraocular movements intact. V - Facial sensation intact bilaterally. VII - Facial movement intact bilaterally. VIII - Hearing & vestibular intact bilaterally. X - Palate elevates symmetrically. XI - Chin turning & shoulder  shrug intact bilaterally. XII - Tongue protrusion intact.  Motor Strength - The patient's strength was normal in all extremities and pronator drift was absent.  Bulk was normal and fasciculations were absent.   Motor Tone - Muscle tone was assessed at the neck and  appendages and was normal.  Reflexes - The patient's reflexes were 1+ in all extremities and she had no pathological reflexes.  Sensory - Light touch, temperature/pinprick, vibration and proprioception, and Romberg testing were assessed and were normal.    Coordination - The patient had normal movements in the hands and feet with no ataxia or dysmetria.  Tremor was absent.  Gait and Station - The patient's transfers, posture, gait, station, and turns were observed as normal.   Functional score  mRS = 0   0 - No symptoms.   1 - No significant disability. Able to carry out all usual activities, despite some symptoms.   2 - Slight disability. Able to look after own affairs without assistance, but unable to carry out all previous activities.   3 - Moderate disability. Requires some help, but able to walk unassisted.   4 - Moderately severe disability. Unable to attend to own bodily needs without assistance, and unable to walk unassisted.   5 - Severe disability. Requires constant nursing care and attention, bedridden, incontinent.   6 - Dead.   NIH Stroke Scale = 0   Data reviewed: I personally reviewed the images and agree with the radiology interpretations.  Dg Chest 2 View 03/07/2016 Mild hyperinflation consistent with known reactive airway disease. No acute cardiopulmonary abnormality. Thoracic aortic atherosclerosis.   CT HEAD 03/06/2016 Negative.   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 No hemodynamically significant stenosis.   CTA HEAD 03/06/2016 Negative.   MRI HEAD  03/07/2016 Normal MRI of the brain.   MRA HEAD  03/07/2016 Normal intracranial MRA.   Lower Ext. Venous Duplex - Negative for deep and superficial vein thrombosis in both legs.   TCD bubble study - moderate PFO  TTE - Left ventricle: The cavity size was normal. Systolic function was normal. The estimated ejection fraction  was in the range of 60% to 65%. Wall motion was normal; there were no regional wall motion abnormalities. Doppler parameters are consistent with abnormal left ventricular relaxation (grade 1 diastolic dysfunction). - Aortic valve: Transvalvular velocity was within the normal range. There was no stenosis. There was no regurgitation. - Mitral valve: There was trivial regurgitation. - Right ventricle: The cavity size was normal. Wall thickness was normal. Systolic function was normal. - Tricuspid valve: There was trivial regurgitation.  TEE Left ventricle: Systolic function was normal. The estimated ejection fraction was in the range of 60% to 65%. Wall motion was normal; there were no regional wall motion abnormalities. - Aortic valve: No evidence of vegetation. - Left atrium: No evidence of thrombus in the atrial cavity or appendage. No evidence of thrombus in the atrial cavity or appendage. - Right atrium: No evidence of thrombus in the atrial cavity or appendage. No evidence of thrombus in the atrial cavity or appendage. - Atrial septum: There was a patent foramen ovale. - Tricuspid valve: No evidence of vegetation. - Pulmonic valve: No evidence of vegetation. Impressions: - No intracardiac source of embolism. Positive bubble study consistent with PFO.  Component     Latest Ref Rng & Units 01/01/2016 03/07/2016  Cholesterol     0 - 200 mg/dL 161 096  Triglycerides     <  150 mg/dL 16.177.0 45  HDL Cholesterol     >40 mg/dL 09.6080.00 78  VLDL     0 - 40 mg/dL 45.415.4 9  LDL (calc)     0 - 99 mg/dL 92 76  Total CHOL/HDL Ratio     RATIO 2 2.1  NonHDL      107.46   Hemoglobin A1C     4.8 - 5.6 % 5.3 5.3  Mean Plasma Glucose     mg/dL  098105  TSH     1.190.35 - 1.474.50 uIU/mL 2.38     Assessment: As you may recall, she is a 46 y.o. Caucasian female with PMH of asthma and seasonal allergiesadmitted on 03/06/2016 for 2 episodes of transient left arm weakness and  slurred speech. 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 TCD bubble study showed moderate PFO, and TEE confirmed PFO. LDL 76 and A1c 5.3. Her TIA symptoms concerning for association with PFO. On discharge, she was put on DAPT for 3-6 months as well as low-dose Lipitor. Plan to repeat CTA neck in 3 months. She saw Dr. Excell Seltzerooper in last week, and will discuss with family about PFO closure procedure. She has quit aggressive workout. No recurrent strokelike symptoms.  Plan:  - continue ASA and plavix and lipitor for stroke prevention  - consider PFO procedure vs. medical treatment - follow up with Dr. Excell Seltzerooper - will repeat CTA neck next visit.  - avoid exercise with aggressive neck abrupt movement, stretch or turning - Follow up with your primary care physician for stroke risk factor modification. Recommend maintain blood pressure goal <130/80, and lipids with LDL cholesterol goal below 70 mg/dL.  - follow up in 3 months with me.   No orders of the defined types were placed in this encounter.   No orders of the defined types were placed in this encounter.   Patient Instructions  - continue ASA and plavix and lipitor for stroke prevention  - consider PFO procedure vs. Conservative treatment - follow up with Dr. Excell Seltzerooper - will check again CT angiogram of neck next visit.  - avoid exercise with aggressive neck movement, stretch or turning - Follow up with your primary care physician for stroke risk factor modification. Recommend maintain blood pressure goal <130/80, and lipids with LDL cholesterol goal below 70 mg/dL.  - follow up in 3 months with me.     Janice PlanJindong Jahrell Hamor, MD PhD [facility] [address], [address] [place], [place] [id number] [phone]

## 2016-03-27 ENCOUNTER — Telehealth: Payer: Self-pay | Admitting: Neurology

## 2016-03-27 ENCOUNTER — Telehealth: Payer: Self-pay | Admitting: Cardiovascular Disease

## 2016-03-27 NOTE — Telephone Encounter (Signed)
I spoke with the pt and made her aware that I have sent a message to our billing department to contact her in regards to PFO closure codes. I also discussed with Dr Excell Seltzerooper that the pt would like to see an allergist to determine if she has an allergy to metal. Per Dr Excell Seltzerooper it is okay to refer the pt to Dr Aris GeorgiaMeg Whalen.   I contacted Dr Shon BatonWhalen's office and [place] at 9:45. Appointment may last 1 1/2-2 hours and no antihistamines 3 days prior to appointment. I will fax the pt's records to [phone].

## 2016-03-27 NOTE — Telephone Encounter (Signed)
New message      Talk to the nurse to see if she can be tested to see if she is allergic to metal.  Also, someone was going to give her the billing codes for the procedure she needs to have.  Pt was seen recently and forgot to get this info

## 2016-03-27 NOTE — Telephone Encounter (Signed)
Pt called she is currently taking Lipitor and Plavix and wanting to know if she is able to take a daily Iron pill, Magnesium, and Tumeric as well as COQ10.  Please call

## 2016-03-27 NOTE — Telephone Encounter (Signed)
I think she asked same question during the OV. I told her that she is able to take those medications. As far as I know, there is no interaction of those medication with ASA, plavix or lipitor. please let the pt know. Thanks.   Marvel PlanJindong Renan Danese, MD PhD Stroke Neurology 03/27/2016 5:55 PM

## 2016-03-28 ENCOUNTER — Encounter: Payer: Self-pay | Admitting: Family Medicine

## 2016-03-28 NOTE — Telephone Encounter (Signed)
I left the pt a detailed voicemail at home number in regards to upcoming appointment at Dr Zelphia CairoWhelan's office.  Records have also been faxed.

## 2016-03-28 NOTE — Telephone Encounter (Signed)
Rn call patient back about taking OTC medication. Rn stated per Dr Roda ShuttersXu note she can take tumeric,iron, mag. Rn also Dr Roda ShuttersXu stated as far as he knows there is no interaction with the aspirin, lipitor or plavix. Pt verbalized understanding.

## 2016-03-28 NOTE — Telephone Encounter (Signed)
The pt is aware of appointment with Dr Barnetta ChapelWhelan.

## 2016-04-25 ENCOUNTER — Telehealth: Payer: Self-pay | Admitting: Cardiovascular Disease

## 2016-04-25 NOTE — Telephone Encounter (Signed)
Follow Up:    Pt wants to know what is the next step? She is having the allergy test on 05-06-16 and saw Dr Barnetta ChapelWhelan today.

## 2016-04-29 ENCOUNTER — Encounter: Payer: Self-pay | Admitting: Cardiovascular Disease

## 2016-04-29 ENCOUNTER — Encounter: Payer: Self-pay | Admitting: Family Medicine

## 2016-04-29 DIAGNOSIS — E782 Mixed hyperlipidemia: Secondary | ICD-10-CM

## 2016-04-30 ENCOUNTER — Other Ambulatory Visit (INDEPENDENT_AMBULATORY_CARE_PROVIDER_SITE_OTHER): Payer: 59

## 2016-04-30 DIAGNOSIS — E782 Mixed hyperlipidemia: Secondary | ICD-10-CM

## 2016-04-30 LAB — LIPID PANEL
CHOLESTEROL: 138 mg/dL (ref 0–200)
HDL: 67.5 mg/dL (ref >39.00)
LDL CALC: 51 mg/dL (ref 0–99)
NonHDL: 70.28
TRIGLYCERIDES: 97 mg/dL (ref 0.0–149.0)
Total CHOL/HDL Ratio: 2
VLDL: 19.4 mg/dL (ref 0.0–40.0)

## 2016-04-30 NOTE — Telephone Encounter (Signed)
Message sent to the pt through My Chart.  I will contact her by phone tomorrow to discuss scheduling PFO closure. I will also request her Echo on CD.

## 2016-05-01 NOTE — Telephone Encounter (Signed)
I spoke with the pt by phone and gave her possible dates for PFO closure.  The pt said she has to speak with her spouse and she will contact the office once she has a date. 03/07/16 Echo placed on CD and put at the front desk for pt to pick-up.  I also addressed concern in regards to dental appointment.  I advised her that once closure device is placed she will need SBE for 6 months after procedure.

## 2016-05-06 ENCOUNTER — Telehealth: Payer: Self-pay | Admitting: Cardiovascular Disease

## 2016-05-06 ENCOUNTER — Encounter: Payer: Self-pay | Admitting: Neurology

## 2016-05-06 DIAGNOSIS — Q211 Atrial septal defect: Secondary | ICD-10-CM

## 2016-05-06 DIAGNOSIS — Z01812 Encounter for preprocedural laboratory examination: Secondary | ICD-10-CM

## 2016-05-06 DIAGNOSIS — Q2112 Patent foramen ovale: Secondary | ICD-10-CM

## 2016-05-06 MED ORDER — AMOXICILLIN 500 MG PO TABS: ORAL_TABLET | ORAL | 1 refills | Status: DC

## 2016-05-06 NOTE — Telephone Encounter (Signed)
Mrs. Ena DawleySamoladas is asking that you give her a call about the procedure for 05/15/16 .  Thanks

## 2016-05-06 NOTE — Telephone Encounter (Signed)
Please see follow-up call from 05/06/16.  Pt scheduled for procedure on 05/15/16 and pre-procedure instructions given to pt.

## 2016-05-06 NOTE — Telephone Encounter (Signed)
I spoke with the pt and made her aware of pre-procedure instructions.  I will also send a copy of instructions through My Chart.

## 2016-05-10 ENCOUNTER — Other Ambulatory Visit: Payer: 59 | Admitting: *Deleted

## 2016-05-10 DIAGNOSIS — Z01812 Encounter for preprocedural laboratory examination: Secondary | ICD-10-CM

## 2016-05-10 DIAGNOSIS — Q2112 Patent foramen ovale: Secondary | ICD-10-CM

## 2016-05-10 DIAGNOSIS — Q211 Atrial septal defect: Secondary | ICD-10-CM

## 2016-05-10 LAB — BASIC METABOLIC PANEL
BUN / CREAT RATIO: 12 (ref 9–23)
BUN: 10 mg/dL (ref 6–24)
CHLORIDE: 100 mmol/L (ref 96–106)
CO2: 24 mmol/L (ref 18–29)
Calcium: 9.9 mg/dL (ref 8.7–10.2)
Creatinine, Ser: 0.85 mg/dL (ref 0.57–1.00)
GFR calc non Af Amer: 84 mL/min/{1.73_m2} (ref >59)
GFR, EST AFRICAN AMERICAN: 97 mL/min/{1.73_m2} (ref >59)
GLUCOSE: 93 mg/dL (ref 65–99)
POTASSIUM: 4.1 mmol/L (ref 3.5–5.2)
SODIUM: 143 mmol/L (ref 134–144)

## 2016-05-10 LAB — CBC
Hematocrit: 39.9 % (ref 34.0–46.6)
Hemoglobin: 13 g/dL (ref 11.1–15.9)
MCH: 29.7 pg (ref 26.6–33.0)
MCHC: 32.6 g/dL (ref 31.5–35.7)
MCV: 91 fL (ref 79–97)
PLATELETS: 239 10*3/uL (ref 150–379)
RBC: 4.37 x10E6/uL (ref 3.77–5.28)
RDW: 13.4 % (ref 12.3–15.4)
WBC: 4.7 10*3/uL (ref 3.4–10.8)

## 2016-05-10 LAB — PROTIME-INR
INR: 1 (ref 0.8–1.2)
PROTHROMBIN TIME: 10.3 s (ref 9.1–12.0)

## 2016-05-12 ENCOUNTER — Encounter: Payer: Self-pay | Admitting: Cardiovascular Disease

## 2016-05-12 ENCOUNTER — Encounter: Payer: Self-pay | Admitting: Neurology

## 2016-05-13 ENCOUNTER — Telehealth: Payer: Self-pay | Admitting: Cardiovascular Disease

## 2016-05-13 ENCOUNTER — Encounter: Payer: Self-pay | Admitting: Family Medicine

## 2016-05-13 NOTE — Telephone Encounter (Signed)
I spoke with the pt and at this time she would like to cancel PFO closure on 05/15/16. The pt said she has thought about the procedure and would like to hold off and see how things go.  I contacted the cath lab and [facility] and procedure cancelled. The pt will continue to follow-up with neurology.

## 2016-05-13 NOTE — Telephone Encounter (Signed)
My Chart message:  Hi Janice Myers-  I've decided to indefinitely postpone the procedure until further notice. As an elective procedure, the necessity of this is not clear to me as it should be.     I plan to see you again for our next check-up appointment.    Thank you, Janice Myers   Left message on machine for pt to contact the office.

## 2016-05-13 NOTE — Telephone Encounter (Signed)
New message    Pt is calling about her procedure. She is wanting to postpone the procedure. She is asking for RN to call her.

## 2016-05-15 ENCOUNTER — Ambulatory Visit (HOSPITAL_COMMUNITY): Admission: RE | Admit: 2016-05-15 | Payer: 59 | Source: Ambulatory Visit | Admitting: Cardiovascular Disease

## 2016-05-15 ENCOUNTER — Encounter (HOSPITAL_COMMUNITY): Admission: RE | Payer: Self-pay | Source: Ambulatory Visit

## 2016-05-15 SURGERY — PATENT FORAMEN OVALE (PFO) CLOSURE

## 2016-05-20 ENCOUNTER — Ambulatory Visit: Payer: Self-pay | Admitting: Neurology

## 2016-05-21 ENCOUNTER — Ambulatory Visit: Payer: Self-pay | Admitting: Neurology

## 2016-05-23 ENCOUNTER — Ambulatory Visit (INDEPENDENT_AMBULATORY_CARE_PROVIDER_SITE_OTHER): Payer: 59 | Admitting: Family Medicine

## 2016-05-23 VITALS — BP 106/70 | HR 76 | Temp 98.4°F | Ht 67.0 in | Wt 152.0 lb

## 2016-05-23 DIAGNOSIS — Z5181 Encounter for therapeutic drug level monitoring: Secondary | ICD-10-CM

## 2016-05-23 DIAGNOSIS — R079 Chest pain, unspecified: Secondary | ICD-10-CM | POA: Diagnosis not present

## 2016-05-23 LAB — CK: Total CK: 82 U/L (ref 7–177)

## 2016-05-23 LAB — COMPREHENSIVE METABOLIC PANEL
ALK PHOS: 36 U/L — AB (ref 39–117)
ALT: 17 U/L (ref 0–35)
AST: 22 U/L (ref 0–37)
Albumin: 4.4 g/dL (ref 3.5–5.2)
BILIRUBIN TOTAL: 0.5 mg/dL (ref 0.2–1.2)
BUN: 9 mg/dL (ref 6–23)
CO2: 29 meq/L (ref 19–32)
CREATININE: 0.82 mg/dL (ref 0.40–1.20)
Calcium: 9.7 mg/dL (ref 8.4–10.5)
Chloride: 103 mEq/L (ref 96–112)
GFR: 80.52 mL/min (ref >60.00)
GLUCOSE: 94 mg/dL (ref 70–99)
Potassium: 4.2 mEq/L (ref 3.5–5.1)
Sodium: 136 mEq/L (ref 135–145)
TOTAL PROTEIN: 7.2 g/dL (ref 6.0–8.3)

## 2016-05-23 LAB — TROPONIN I: TNIDX: 0 ug/l (ref 0.00–0.06)

## 2016-05-23 MED ORDER — SUCRALFATE 1 G PO TABS: 1.0000 g | ORAL_TABLET | Freq: Three times a day (TID) | ORAL | 0 refills | Status: DC

## 2016-05-23 MED ORDER — GI COCKTAIL ~~LOC~~: 30.0000 mL | Freq: Once | ORAL | Status: DC

## 2016-05-23 NOTE — Patient Instructions (Signed)
It was very nice to see you today- we are going to get some labs today to help us make sure that you do not have any evidence of kidney damage (rhabdo) or heart muscle damage It seems that your symptoms may indeed be due to stomach acid- this could be made worse by stress and recent events!  Purchase a box of OTC zantact and take for 2 weeks, and also use the carfate with meals and before bed for 7- 10 days I will be in touch with your troponin today and will send a message to Dr. Excell Seltzerooper regarding your question about procardia  Certainly for the time being you can stop taking the lipitor

## 2016-05-23 NOTE — Progress Notes (Signed)
[facility] [address], [address] [place], [place] [address] [phone]  Date:  05/23/2016   Name:  Janice Myers   DOB:  1970-12-06   MRN:  [id number]  PCP:  Abbe Amsterdam, MD    Chief Complaint: Follow-up (c/o leg twitching and at night x 1 month. Also feeling soreness/pressure in chest x 2 weeks. )   History of Present Illness:  Janice Myers is a 46 y.o. very pleasant female patient who presents with the following:  Here today to follow-up from TIA that occurred in late 2017.  She is concerned about CP  Followed by Nuerology Roda Shutters), because of stroke history and Cardiology Excell Seltzer).  Canceled elective PFO procedure.  Wanting to wait for now.    In her family, has history of Mitral Valve Prolapse.   Her uncle is a cardiologist and has been giving her advice.  He is not convinced that she has a PFO and wonders if she has MVP, and has suggested procardia as a possible good medication for her symptoms. She would like for me to run this by her cardiologist Dr. Excell Seltzer  Has been having leg twitches at night.  Feels like a current is going through her legs.  This has been going on for about 2 months.  Denies pain, just a weird sensation.  Has been having "muscle soreness" in chest (last 2 weeks), and is concerned about breakdown of muscle tissue.  States that her cardiogist and neurologist are okay with her stopping Lipitor and Plavix, and only taking a 81mg  aspirin.  Has been scared to exercise, due to her cardiac issues.  Has not exercised since December.  Denies any heavy lifting or other injury to her chest.  Chest pain described as "soreness and pressure."  Constant.  Feeling some SOB at times.  Reports that the skin on her chest is a little red.  Soreness feels like it is above the rib cage. She has not had any sweating, nausea, or jaw pain.  The CP is not worse with activity   Has had normal mammogram in the past.    She  is on baby aspirin and plavix    BP Readings from Last 3 Encounters:  05/23/16 106/70  03/25/16 112/77  03/22/16 114/74     Patient Active Problem List   Diagnosis Date Noted  . Dissection of vertebral artery (HCC)   . PFO (patent foramen ovale)   . TIA (transient ischemic attack) 03/06/2016  . Asthma 03/06/2016  . 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 Asthma   . Seasonal allergies   . Stroke [facility])     Past Surgical History:  Procedure Laterality Date  . ANTERIOR CRUCIATE LIGAMENT REPAIR  2013  . KELOID EXCISION  1992  . TEE WITHOUT CARDIOVERSION N/A 03/08/2016   Procedure: TRANSESOPHAGEAL ECHOCARDIOGRAM (TEE);  Surgeon: Lars Masson, MD;  Location: [facility] ENDOSCOPY;  Service: Cardiovascular;  Laterality: N/A;    Social History  Substance Use Topics  . Smoking status: Never Smoker  . Smokeless tobacco: Never Used  . Alcohol use Yes     Comment: Seldom    Family History  Problem Relation Age of Onset  . Stroke Mother   . Parkinson's disease Mother   . Parkinsonism Mother   . Hyperlipidemia Father   . Alzheimer's disease Maternal Grandmother   . Prostate cancer Maternal  Grandfather   . Heart disease Paternal Grandfather   . Epilepsy Other     Uncle    No Known Allergies  Medication list has been reviewed and updated.  Current Outpatient Prescriptions on File Prior to Visit  Medication Sig Dispense Refill  . albuterol (PROVENTIL HFA;VENTOLIN HFA) 108 (90 Base) MCG/ACT inhaler Inhale 1 puff into the lungs every 6 (six) hours as needed for wheezing or shortness of breath. 18 g 6  . amoxicillin (AMOXIL) 500 MG tablet Take 4 tablets by mouth one hour prior to dental appointment. Only required first 6 months after PFO closure. 8 tablet 1  . aspirin 81 MG chewable tablet Chew 1 tablet (81 mg total) by mouth daily. 90 tablet 0  . atorvastatin (LIPITOR) 10 MG tablet Take 1 tablet  (10 mg total) by mouth daily at 6 PM. 90 tablet 3  . clopidogrel (PLAVIX) 75 MG tablet Take 1 tablet (75 mg total) by mouth daily. 90 tablet 0   No current facility-administered medications on file prior to visit.     Review of Systems:  As per HPI, otherwise negative.  Physical Examination: Vitals:   05/23/16 0857  BP: 106/70  Pulse: 76  Temp: 98.4 F (36.9 C)   Vitals:   05/23/16 0857  Weight: 152 lb (68.9 kg)  Height: 5\' 7"  (1.702 m)   Body mass index is 23.81 kg/m. Ideal Body Weight: Weight in (lb) to have BMI = 25: 159.3  Physical Examination: General appearance - alert, well appearing, and in no distress and oriented to person, place, and time Mental status - alert, oriented to person, place, and time, normal mood, behavior, speech, dress, motor activity, and thought processes Eyes - pupils equal and reactive, extraocular eye movements intact Mouth - mucous membranes moist, pharynx normal without lesions Neck - supple, no significant adenopathy Lymphatics - no palpable lymphadenopathy, no hepatosplenomegaly Chest - clear to auscultation, no wheezes, rales or rhonchi, symmetric air entry, CP not reproducable Heart - normal rate, regular rhythm, normal S1, S2, no murmurs, rubs, clicks or gallops Abdomen - soft, nontender, nondistended, no masses or organomegaly Breasts - breasts appear normal, no suspicious masses, no skin or nipple changes or axillary nodes Neurological - alert, oriented, normal speech, no focal findings or movement disorder noted Musculoskeletal - no joint tenderness, deformity or swelling Extremities - peripheral pulses normal, no pedal edema, no clubbing or cyanosis Skin - normal coloration and turgor, no rashes, no suspicious skin lesions noted Normal breast exam bilaterally Cannot reproduce CP by pressing on her chest wall  Epigastric / chest pain relieved by GI cocktail.   EKG today compared with previous- no acute or concerning changes.   NSR, no ST changes noted  Discussed case with DOD at [facility] cardiology.  He agrees that as her EKG is reassuring and her CP is much improved following GI cocktail that cardiac origin of her pain is unlikely.   Assessment and Plan:  Chest pain, unspecified type - Plan: EKG 12-Lead, gi cocktail (Maalox,Lidocaine,Donnatal), Troponin I, sucralfate (CARAFATE) 1 g tablet  Medication monitoring encounter - Plan: Comprehensive metabolic panel, CK (Creatine Kinase)  Here today with 2 weeks of constant CP.  Her EKG is reassuring and sx remit with GI cocktail today. Will obain labs as above.  She may indeed stop using the lipitor for now while we await her labs Doubt cardiac cause of her current sx- will treat for likely GERD with carafate and an H2 blocker.  Advised her to seek  care right away if she has any worsening or severe CP Otherwise will plan to contact her pending her labs, will obtain a stat troponin today as well for further reassurance  Patient instructions:  It was very nice to see you today- we are going to get some labs today to help us make sure that you do not have any evidence of kidney damage (rhabdo) or heart muscle damage It seems that your symptoms may indeed be due to stomach acid- this could be made worse by stress and recent events!  Purchase a box of OTC zantac and take for 2 weeks, and also use the carfate with meals and before bed for 7- 10 days I will be in touch with your troponin today and will send a message to Dr. Excell Seltzerooper regarding your question about procardia  Certainly for the time being you can stop taking the lipitor   Signed Abbe AmsterdamJessica Logen Heintzelman, MD  Results for orders placed or performed in visit on 05/23/16  Comprehensive metabolic panel  Result Value Ref Range   Sodium 136 135 - 145 mEq/L   Potassium 4.2 3.5 - 5.1 mEq/L   Chloride 103 96 - 112 mEq/L   CO2 29 19 - 32 mEq/L   Glucose, Bld 94 70 - 99 mg/dL   BUN 9 6 - 23 mg/dL   Creatinine, Ser 1.610.82 0.40 - 1.20  mg/dL   Total Bilirubin 0.5 0.2 - 1.2 mg/dL   Alkaline Phosphatase 36 (L) 39 - 117 U/L   AST 22 0 - 37 U/L   ALT 17 0 - 35 U/L   Total Protein 7.2 6.0 - 8.3 g/dL   Albumin 4.4 3.5 - 5.2 g/dL   Calcium 9.7 8.4 - 09.610.5 mg/dL   GFR 04.5480.52 >09.81>60.00 mL/min  CK (Creatine Kinase)  Result Value Ref Range   Total CK 82 7 - 177 U/L  Troponin I  Result Value Ref Range   TNIDX 0.00 0.00 - 0.06 ug/l

## 2016-06-06 ENCOUNTER — Telehealth: Payer: Self-pay

## 2016-06-06 NOTE — Telephone Encounter (Signed)
Pt had requested earlier appt with Dr Excell Seltzerooper.  I left a message for the pt to contact the office tomorrow morning ASAP in regards to being added to Dr Cooper's schedule on 06/07/16 at 12:00.

## 2016-06-14 ENCOUNTER — Encounter: Payer: Self-pay | Admitting: Family Medicine

## 2016-06-20 ENCOUNTER — Encounter: Payer: Self-pay | Admitting: Cardiovascular Disease

## 2016-07-08 ENCOUNTER — Ambulatory Visit (INDEPENDENT_AMBULATORY_CARE_PROVIDER_SITE_OTHER): Payer: 59 | Admitting: Cardiovascular Disease

## 2016-07-08 ENCOUNTER — Encounter: Payer: Self-pay | Admitting: Cardiovascular Disease

## 2016-07-08 VITALS — BP 116/74 | HR 76 | Ht 67.0 in | Wt 152.1 lb

## 2016-07-08 DIAGNOSIS — Q211 Atrial septal defect: Secondary | ICD-10-CM | POA: Diagnosis not present

## 2016-07-08 DIAGNOSIS — Q2112 Patent foramen ovale: Secondary | ICD-10-CM

## 2016-07-08 NOTE — Patient Instructions (Signed)
Medication Instructions:  Your physician recommends that you continue on your current medications as directed. Please refer to the Current Medication list given to you today.  Labwork: No new orders.   Testing/Procedures: No new orders.   Follow-Up: Your physician recommends that you schedule a follow-up appointment as needed with  Dr Cooper.    Any Other Special Instructions Will Be Listed Below (If Applicable).     If you need a refill on your cardiac medications before your next appointment, please call your pharmacy.   

## 2016-07-08 NOTE — Progress Notes (Signed)
Cardiology Office Note Date:  07/08/2016   ID:  Janice Myers, DOB May 23, 1970, MRN [id number]  PCP:  Abbe Amsterdam, MD  Cardiologist:  Tonny Bollman, MD    Chief Complaint  Patient presents with  . PFO     History of Present Illness: Janice Myers is a 46 y.o. female who presents for follow-up of PFO. Sh presented in December 2017 with a TIA, manifest as slurred speech, left arm heaviness and weakness. MRI/MRA of the brain was normal. 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 This finding was not felt to correspond with her TIA symptoms. She underwent a transcranial Doppler study that demonstrated moderate right to left shunting. The TEE demonstrated PFO with a positive microcavitation study at rest suggestive of at least moderate shunt. She is referred for evaluation of transcatheter PFO closure.  She is here with her husband today. Has lots of questions about previous studies. No recurrent neurologic symptoms. No complaints. Today, she denies symptoms of palpitations, chest pain, shortness of breath, orthopnea, PND, lower extremity edema, dizziness, or syncope.   Past Medical History:  Diagnosis Date  . Asthma   . Seasonal allergies   . Stroke [facility])     Past Surgical History:  Procedure Laterality Date  . ANTERIOR CRUCIATE LIGAMENT REPAIR  2013  . KELOID EXCISION  1992  . TEE WITHOUT CARDIOVERSION N/A 03/08/2016   Procedure: TRANSESOPHAGEAL ECHOCARDIOGRAM (TEE);  Surgeon: Lars Masson, MD;  Location[facility] ENDOSCOPY;  Service: Cardiovascular;  Laterality: N/A;    Current Outpatient Prescriptions  Medication Sig Dispense Refill  . albuterol (PROVENTIL HFA;VENTOLIN HFA) 108 (90 Base) MCG/ACT inhaler Inhale 1 puff into the lungs every 6 (six) hours as needed for wheezing or shortness of breath. 18 g 6  . aspirin 81 MG chewable tablet Chew 1 tablet (81 mg total) by mouth daily. 90 tablet 0    Current Facility-Administered Medications  Medication Dose Route Frequency Provider Last Rate Last Dose  . gi cocktail (Maalox,Lidocaine,Donnatal)  30 mL Oral Once Pearline Cables, MD        Allergies:   Patient has no known allergies.   Social History:  The patient  reports that she has never smoked. She has never used smokeless tobacco. She reports that she drinks alcohol. She reports that she does not use drugs.   Family History:  The patient's family history includes Alzheimer's disease in her maternal grandmother; Epilepsy in her other; Heart disease in her paternal grandfather; Hyperlipidemia in her father; Parkinson's disease in her mother; Parkinsonism in her mother; Prostate cancer in her maternal grandfather; Stroke in her mother.    ROS:  Please see the history of present illness.   All other systems are reviewed and negative.    PHYSICAL EXAM: VS:  BP 116/74   Pulse 76   Ht  (1.702 m)   Wt 152 lb 1.9 oz (69 kg)   BMI 23.83 kg/m  , BMI Body mass index is 23.83 kg/m. GEN: Well nourished, well developed, in no acute distress  HEENT: normal  Neck: no JVD, no masses. No carotid bruits Cardiac: RRR without murmur or gallop                Respiratory:  clear to auscultation bilaterally, normal work of breathing GI: soft, nontender, nondistended, + BS MS: no deformity or atrophy  Ext: no pretibial edema, pedal pulses 2+= bilaterally Skin: warm and dry, no rash Neuro:  Strength and sensation are intact Psych: euthymic mood, full affect  EKG:  EKG is not ordered today.   Recent Labs: 01/01/2016: TSH 2.38 03/08/2016: Hemoglobin 13.0 05/10/2016: Platelets 239 05/23/2016: ALT 17; BUN 9; Creatinine, Ser 0.82; Potassium 4.2; Sodium 136   Lipid Panel     Component Value Date/Time   CHOL 138 04/30/2016 1004   TRIG 97.0 04/30/2016 1004   HDL 67.50 04/30/2016 1004   CHOLHDL 2 04/30/2016 1004   VLDL 19.4 04/30/2016 1004   LDLCALC 51 04/30/2016 1004      Wt  Readings from Last 3 Encounters:  07/08/16 152 lb 1.9 oz (69 kg)  05/23/16 152 lb (68.9 kg)  03/25/16 150 lb 12.8 oz (68.4 kg)     Cardiac Studies Reviewed: Echo 03-07-2016: Study Conclusions  - Left ventricle: The cavity size was normal. Systolic function was   normal. The estimated ejection fraction was in the range of 60%   to 65%. Wall motion was normal; there were no regional wall   motion abnormalities. Doppler parameters are consistent with   abnormal left ventricular relaxation (grade 1 diastolic   dysfunction). - Aortic valve: Transvalvular velocity was within the normal range.   There was no stenosis. There was no regurgitation. - Mitral valve: There was trivial regurgitation. - Right ventricle: The cavity size was normal. Wall thickness was   normal. Systolic function was normal. - Tricuspid valve: There was trivial regurgitation.  TCD bubble study 03-07-2016: Summary:  - Transcranial Doppler Bubble Study was performed at the lab after   taking written informed consent from the patient and explaining   risk/benefits. The right middle cerebral artery was insonated   using a hand held probe. And IV line had been previously inserted   in the left forearm by the RN using aseptic precautions. Agitated   saline injection at rest and after valsalva maneuver.     At rest, after injection it did result in curtain effect of high   intensity transient signals (HITS). With valsalva, injection   again resulted in short period of curtain effect initially but   was later suppressed by valsalva maneuver. - positive TCD bubble study indicating a moderate sized   intracardiac shunt. Result share with pt at the bedside and   answered her questions.  Echo TEE 03-08-2016: Study Conclusions  - Left ventricle: Systolic function was normal. The estimated   ejection fraction was in the range of 60% to 65%. Wall motion was   normal; there were no regional wall motion  abnormalities. - Aortic valve: No evidence of vegetation. - Left atrium: No evidence of thrombus in the atrial cavity or   appendage. No evidence of thrombus in the atrial cavity or   appendage. - Right atrium: No evidence of thrombus in the atrial cavity or   appendage. No evidence of thrombus in the atrial cavity or   appendage. - Atrial septum: There was a patent foramen ovale. - Tricuspid valve: No evidence of vegetation. - Pulmonic valve: No evidence of vegetation.  Impressions:  - No intracardiac source of embolism.   Positive bubble study consistent with PFO.  ASSESSMENT AND PLAN: PFO with cryptogenic stroke: lengthy discussion with patient and husband today about the natural history and association btwn PFO and stroke in a similar population of patients. I have personally reviewed her echo study images and she has a moderate to large PFO with spontaneous right to left shunting. The patient's Rope Score is 7, indicating a moderate/high probability that  the patient's stroke is 'PFO-related.'  The patient is counseled about the association of PFO and cryptogenic stroke. Available clinical trial data is reviewed, specifically those trials comparing transcatheter PFO closure and medical therapy with antiplatelet drugs. The patient understands the potential benefit of PFO closure with respect to secondary stroke reduction compared with medical therapy alone. Specific risks of transcatheter PFO closure are reviewed with the patient. These risks include bleeding, infection, device embolization, stroke, cardiac perforation, tamponade, arrhythmia, MI, and late device erosion. She understands these serious risks occur at low incidence of < 1%.   She and her husband would like to discuss things further with Dr Roda Shutters. They are scheduled to see him next week. I would be happy to set her up for PFO closure if she decides to pursue this. Otherwise she will remain on antiplatelet Rx with [facility].   Current  medicines are reviewed with the patient today.  The patient does not have concerns regarding medicines.  Labs/ tests ordered today include:  No orders of the defined types were placed in this encounter.   Disposition:   FU prn  Signed, Tonny Bollman, MD  07/08/2016 9:06 AM    [facility] [address] [place], [place], [place]  [address] Phone: [phone]; Fax: [phone]

## 2016-07-15 ENCOUNTER — Encounter: Payer: Self-pay | Admitting: Neurology

## 2016-07-15 ENCOUNTER — Ambulatory Visit (INDEPENDENT_AMBULATORY_CARE_PROVIDER_SITE_OTHER): Payer: 59 | Admitting: Neurology

## 2016-07-15 VITALS — BP 138/90 | HR 80 | Ht 67.0 in | Wt 152.0 lb

## 2016-07-15 DIAGNOSIS — Q2112 Patent foramen ovale: Secondary | ICD-10-CM

## 2016-07-15 DIAGNOSIS — G451 Carotid artery syndrome (hemispheric): Secondary | ICD-10-CM

## 2016-07-15 DIAGNOSIS — Q211 Atrial septal defect: Secondary | ICD-10-CM

## 2016-07-15 DIAGNOSIS — I7774 Dissection of vertebral artery: Secondary | ICD-10-CM | POA: Diagnosis not present

## 2016-07-15 NOTE — Progress Notes (Signed)
STROKE NEUROLOGY FOLLOW UP NOTE  NAME: [name] DOB: April 29, 1970  REASON FOR VISIT: stroke follow up HISTORY FROM: pt and husband  Today we had the pleasure of seeing Janice Myers in follow-up at our Neurology Clinic. Pt was accompanied by husband.   History Summary Janice Myers is a 46 y.o. female with history of asthma and seasonal allergiesadmitted on 03/06/2016 for 2 episodes of transient left arm weakness and slurred speech. She was speaking to her youngest, suddenly her speech came out garbled. This lasted for about 1 minute and resolved. Llater she developed numbness and weakness in her left arm that she couldn't make a fist.  she held her arms up and noticed that the left arm was drifting down that lasted about 2 hours and resolved. She had associated left neck and shoulder stiffness at that same time which had been intermittent for a week. She was on a new, aggressive work out regimen where she lifts heavy buckets filled with cement, does burpees, runs with sandbags, etc.   Stroke workup showed negative CT and MRI brain, MRA head, CTA head, TTE, and LE venous Doppler. 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 TCD bubble study showed moderate PFO, and TEE confirmed PFO. LDL 76 and A1c 5.3. Her TIA symptoms not consistent with VA dissection, concerning for association with PFO. She was referred to Dr. Excell Seltzer for further evaluation as outpatient. On discharge, she was put on DAPT for 3-6 months as well as low-dose Lipitor. Plan to repeat CTA neck in 3 months.  03/25/16 follow up - the patient has been doing well. No recurrent stroke symptoms. She went to see Dr. Excell Seltzer in last week, still undecided on PFO closure procedure. She has quit aggressive work on at this time. I answered all her questions regarding PFO procedure and recurrent stroke. BP today 112/70.  Interval  History During the interval time, pt has been doing well. No recurrent stroke. Has been following with Dr. Excell Seltzer and have decided on no intervention. Lipitor also stopped due to intolerance. Currently on aspirin 81 mg only. Will repeat a CTA.  REVIEW OF SYSTEMS: Full 14 system review of systems performed and notable only for those listed below and in HPI above, all others are negative:  Constitutional:   Cardiovascular:  Ear/Nose/Throat:   Skin:  Eyes:   Respiratory:   Gastroitestinal:   Genitourinary:  Hematology/Lymphatic:   Endocrine:  Musculoskeletal:   Allergy/Immunology:   Neurological:  Headache Psychiatric:  Sleep:   The following represents the patient's updated allergies and side effects list: No Known Allergies  The neurologically relevant items on the patient's problem list were reviewed on today's visit.  Neurologic Examination  A problem focused neurological exam (12 or more points of the single system neurologic examination, vital signs counts as 1 point, cranial nerves count for 8 points) was performed.  Blood pressure 138/90, pulse 80, height  (1.702 m), weight 152 lb (68.9 kg).  General - Well nourished, well developed, in no apparent distress.  Ophthalmologic - Sharp disc margins OU.   Cardiovascular - Regular rate and rhythm with no murmur.  Mental Status -  Level of arousal and orientation to time, place, and person were intact. Language including expression, naming, repetition, comprehension was assessed and found intact. Fund of Knowledge was assessed and was intact.  Cranial Nerves II - XII - II - Visual field intact OU. III, IV,  VI - Extraocular movements intact. V - Facial sensation intact bilaterally. VII - Facial movement intact bilaterally. VIII - Hearing & vestibular intact bilaterally. X - Palate elevates symmetrically. XI - Chin turning & shoulder shrug intact bilaterally. XII - Tongue protrusion intact.  Motor Strength - The  patient's strength was normal in all extremities and pronator drift was absent.  Bulk was normal and fasciculations were absent.   Motor Tone - Muscle tone was assessed at the neck and appendages and was normal.  Reflexes - The patient's reflexes were 1+ in all extremities and she had no pathological reflexes.  Sensory - Light touch, temperature/pinprick, vibration and proprioception, and Romberg testing were assessed and were normal.    Coordination - The patient had normal movements in the hands and feet with no ataxia or dysmetria.  Tremor was absent.  Gait and Station - The patient's transfers, posture, gait, station, and turns were observed as normal.    Data reviewed: I personally reviewed the images and agree with the radiology interpretations.  Dg Chest 2 View 03/07/2016 Mild hyperinflation consistent with known reactive airway disease. No acute cardiopulmonary abnormality. Thoracic aortic atherosclerosis.   CT HEAD 03/06/2016 Negative.   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 No hemodynamically significant stenosis.   CTA HEAD 03/06/2016 Negative.   MRI HEAD  03/07/2016 Normal MRI of the brain.   MRA HEAD  03/07/2016 Normal intracranial MRA.   Lower Ext. Venous Duplex - Negative for deep and superficial vein thrombosis in both legs.   TCD bubble study - moderate PFO  TTE - Left ventricle: The cavity size was normal. Systolic function was normal. The estimated ejection fraction was in the range of 60% to 65%. Wall motion was normal; there were no regional wall motion abnormalities. Doppler parameters are consistent with abnormal left ventricular relaxation (grade 1 diastolic dysfunction). - Aortic valve: Transvalvular velocity was within the normal range. There was no stenosis. There was no regurgitation. - Mitral valve: There was trivial regurgitation. - Right ventricle: The cavity  size was normal. Wall thickness was normal. Systolic function was normal. - Tricuspid valve: There was trivial regurgitation.  TEE Left ventricle: Systolic function was normal. The estimated ejection fraction was in the range of 60% to 65%. Wall motion was normal; there were no regional wall motion abnormalities. - Aortic valve: No evidence of vegetation. - Left atrium: No evidence of thrombus in the atrial cavity or appendage. No evidence of thrombus in the atrial cavity or appendage. - Right atrium: No evidence of thrombus in the atrial cavity or appendage. No evidence of thrombus in the atrial cavity or appendage. - Atrial septum: There was a patent foramen ovale. - Tricuspid valve: No evidence of vegetation. - Pulmonic valve: No evidence of vegetation. Impressions: - No intracardiac source of embolism. Positive bubble study consistent with PFO.  Component     Latest Ref Rng & Units 01/01/2016 03/07/2016  Cholesterol     0 - 200 mg/dL 409 811  Triglycerides     <150 mg/dL 91.4 45  HDL Cholesterol     >40 mg/dL 78.29 78  VLDL     0 - 40 mg/dL 56.2 9  LDL (calc)     0 - 99 mg/dL 92 76  Total CHOL/HDL Ratio     RATIO 2 2.1  NonHDL      107.46   Hemoglobin A1C     4.8 - 5.6 % 5.3 5.3  Mean Plasma Glucose     mg/dL  161  TSH     0.96 - 0.45 uIU/mL 2.38     Assessment: As you may recall, she is a 46 y.o. Caucasian female with PMH of asthma and seasonal allergiesadmitted on 03/06/2016 for 2 episodes of transient left arm weakness and slurred speech. 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 TCD bubble study showed moderate PFO, and TEE confirmed PFO. LDL 76 and A1c 5.3. Her TIA symptoms concerning for association with PFO. On discharge, she was put on DAPT for 3-6 months as well as low-dose Lipitor.  Finished the DAPT course, currently on aspirin 81. Lipitor discontinued due to intolerance. Follow with Dr. Excell Seltzer, decided on no intervention. She has quit aggressive workout. No recurrent strokelike symptoms. Will repeat CTA neck.  Plan:  - continue ASA for stroke prevention  - will repeat CTA neck to evaluate vertebral artery.  - avoid exercise with aggressive neck abrupt movement, stretch or turning. - Follow up with your primary care physician for stroke risk factor modification. Recommend maintain blood pressure goal <130/80, and lipids with LDL cholesterol goal below 70 mg/dL.  - follow up as needed.   I spent more than 25 minutes of face to face time with the patient. Greater than 50% of time was spent in counseling and coordination of care. We discussed about PFO closure procedure, avoid aggressive neck movement, and repeat CTA neck.   Orders Placed This Encounter  Procedures  . CT ANGIO NECK W OR WO CONTRAST    Standing Status:   Future    Standing Expiration Date:   09/14/2017    Order Specific Question:   If indicated for the ordered procedure, I authorize the administration of contrast media per Radiology protocol    Answer:   Yes    Order Specific Question:   Reason for Exam (SYMPTOM  OR DIAGNOSIS REQUIRED)    Answer:   hx of veterbral dissection    Order Specific Question:   Is patient pregnant?    Answer:   No    Order Specific Question:   Preferred imaging location?    Answer:   [facility]    Order Specific Question:   Radiology Contrast Protocol - do NOT remove file path    Answer:   [email or web address]    No orders of the defined types were placed in this encounter.   Patient Instructions  - continue ASA for stroke prevention  - will repeat CTA neck to evaluate vertebral artery.  - avoid exercise with aggressive neck abrupt movement, stretch or turning. - Follow up with your primary care physician for stroke risk factor  modification. Recommend maintain blood pressure goal <130/80, and lipids with LDL cholesterol goal below 70 mg/dL.  - follow up as needed.    Marvel Plan, MD PhD [facility] [address] [place], [place] [address] [phone]

## 2016-07-15 NOTE — Patient Instructions (Signed)
-   continue ASA for stroke prevention  - will repeat CTA neck to evaluate vertebral artery.  - avoid exercise with aggressive neck abrupt movement, stretch or turning. - Follow up with your primary care physician for stroke risk factor modification. Recommend maintain blood pressure goal <130/80, and lipids with LDL cholesterol goal below 70 mg/dL.  - follow up as needed.

## 2016-07-18 ENCOUNTER — Ambulatory Visit
Admission: RE | Admit: 2016-07-18 | Discharge: 2016-07-18 | Disposition: A | Discharge status: Home / Self Care | Payer: 59 | Source: Ambulatory Visit | Attending: Neurology | Admitting: Neurology

## 2016-07-18 ENCOUNTER — Encounter: Payer: Self-pay | Admitting: Neurology

## 2016-07-18 DIAGNOSIS — I7774 Dissection of vertebral artery: Secondary | ICD-10-CM

## 2016-07-18 MED ORDER — IOPAMIDOL (ISOVUE-370) INJECTION 76%
75.0000 mL | Freq: Once | INTRAVENOUS | Status: AC | PRN
  Administered 2016-07-18: 75 mL via INTRAVENOUS

## 2016-07-23 ENCOUNTER — Telehealth: Payer: Self-pay | Admitting: Neurology

## 2016-07-23 NOTE — Telephone Encounter (Signed)
Patient called office requesting results of CTA neck call was forwarded to RN Katrina.

## 2016-07-23 NOTE — Telephone Encounter (Signed)
Rn receive incoming call from patient about Ct angio neck results. 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 Normal vessel this time. Please continue current treatment. Thanks. Rn stated there is no medication per Dr. Roda ShuttersXu to take. This is a normal scan for her age. PT verbalized understanding.       Marvel PlanXu, Jindong, MD  to Janice EllisMaria Myers       12:07 PM  Yes. Our nurse is going to call you for the result. From the image, the right vertebral artery small dissection has been healed. Normal vessel this time. Please continue current treatment. The skeleton findings are normal for your age, mild degenerative changes happens as you age. No need to worry. Have a nice weekend.   Marvel PlanJindong Xu, MD PhD  Stroke Neurology  07/20/2016  12:07 PM

## 2016-08-22 ENCOUNTER — Other Ambulatory Visit: Payer: Self-pay | Admitting: Family Medicine

## 2016-11-26 ENCOUNTER — Encounter: Payer: Self-pay | Admitting: Family Medicine

## 2016-11-26 ENCOUNTER — Other Ambulatory Visit: Payer: Self-pay | Admitting: Family Medicine

## 2016-11-26 MED ORDER — FLUOXETINE HCL 20 MG PO TABS: 20.0000 mg | ORAL_TABLET | Freq: Every day | ORAL | 5 refills | Status: DC

## 2016-12-06 ENCOUNTER — Other Ambulatory Visit: Payer: Self-pay | Admitting: Family Medicine

## 2016-12-09 ENCOUNTER — Other Ambulatory Visit: Payer: Self-pay | Admitting: Family Medicine

## 2016-12-09 DIAGNOSIS — Z1231 Encounter for screening mammogram for malignant neoplasm of breast: Secondary | ICD-10-CM

## 2017-01-24 ENCOUNTER — Ambulatory Visit
Admission: RE | Admit: 2017-01-24 | Discharge: 2017-01-24 | Disposition: A | Discharge status: Home / Self Care | Payer: 59 | Source: Ambulatory Visit | Attending: Family Medicine | Admitting: Family Medicine

## 2017-01-24 DIAGNOSIS — Z1231 Encounter for screening mammogram for malignant neoplasm of breast: Secondary | ICD-10-CM

## 2017-02-05 IMAGING — DX DG CHEST 2V
2 series · 2 of 2 positions shown · non-contrast
Comparison: None.

CLINICAL DATA: TIA 2 days ago with persistent weakness. History of
asthma, carotid artery dissection.

EXAM:
CHEST  2 VIEW

[w chest pa]
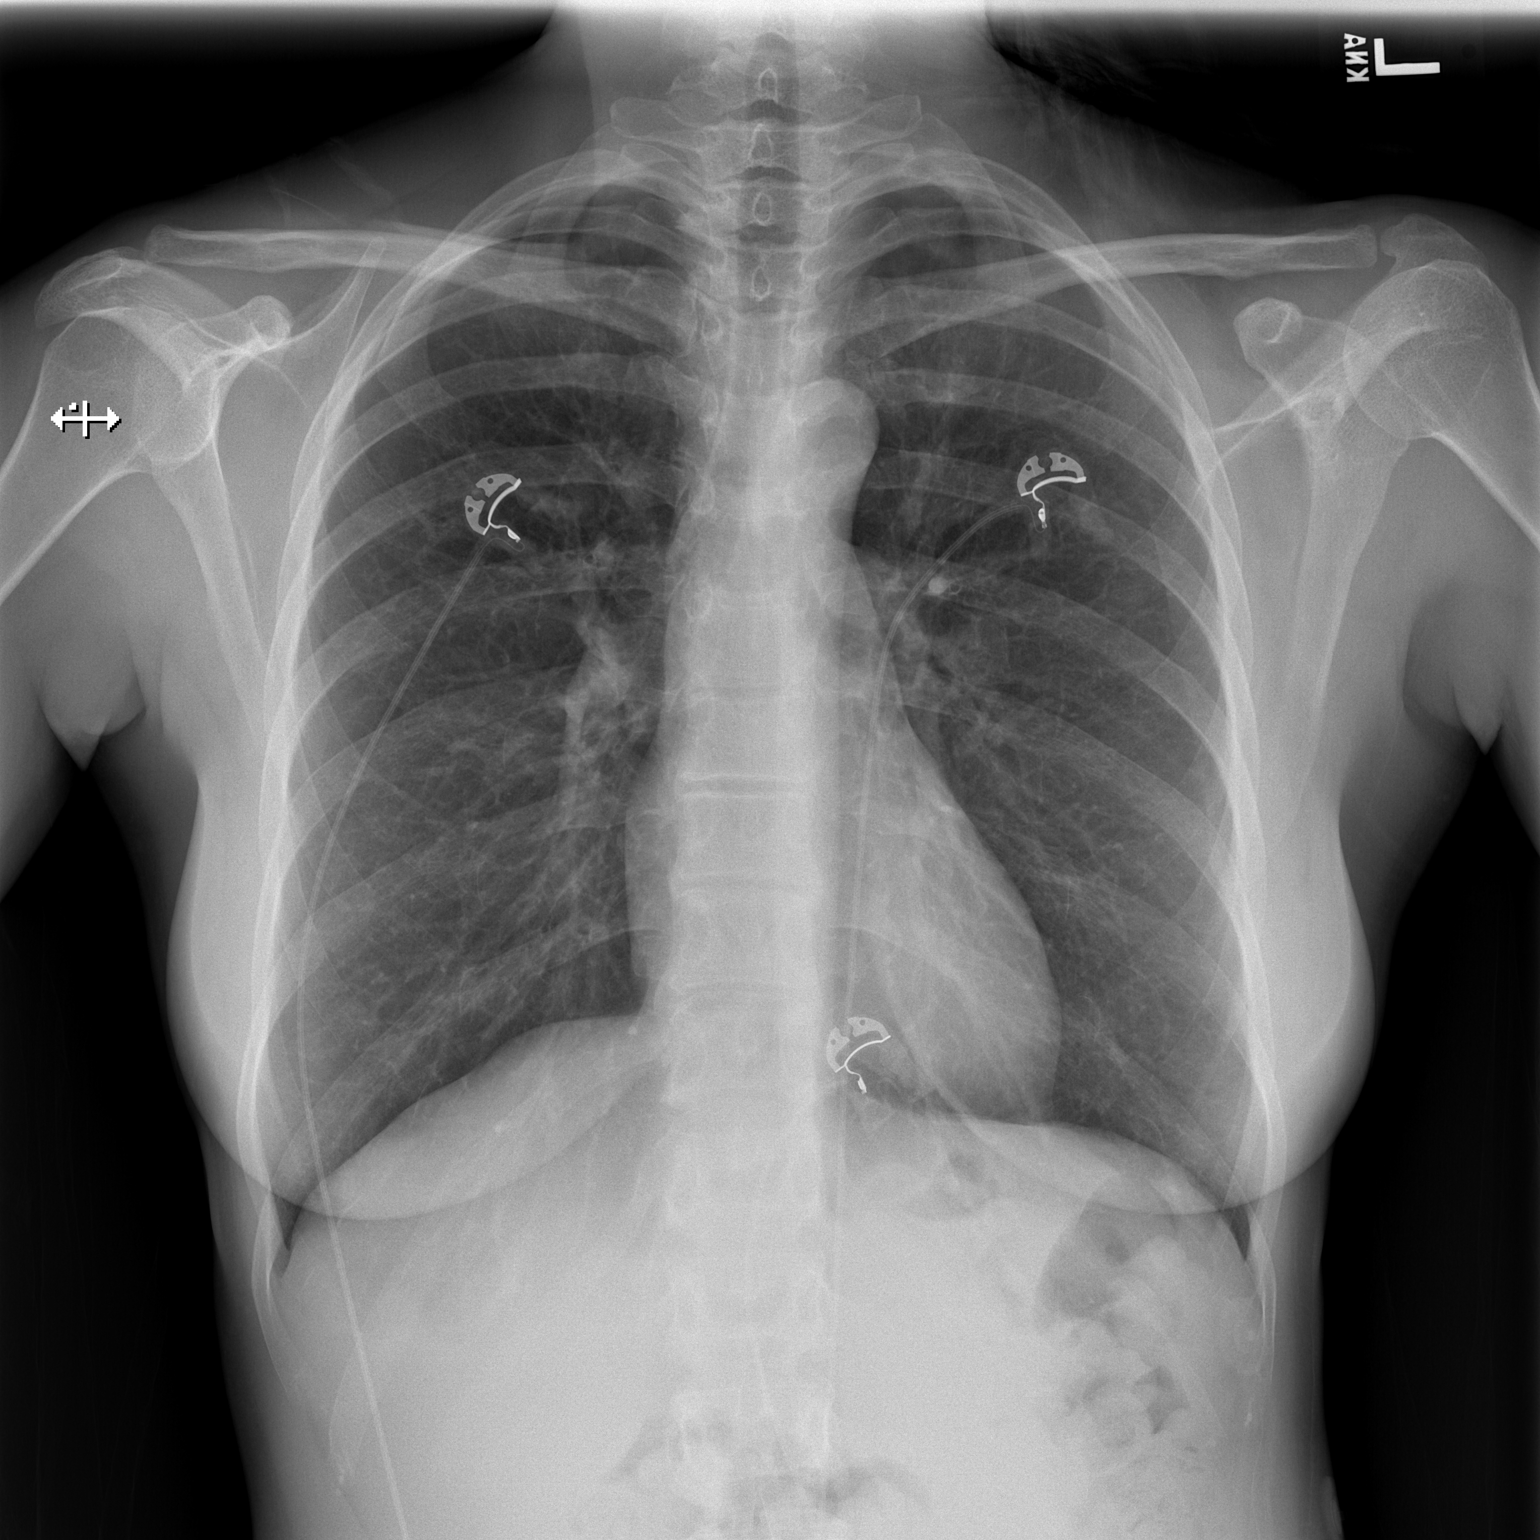

[w chest lat]
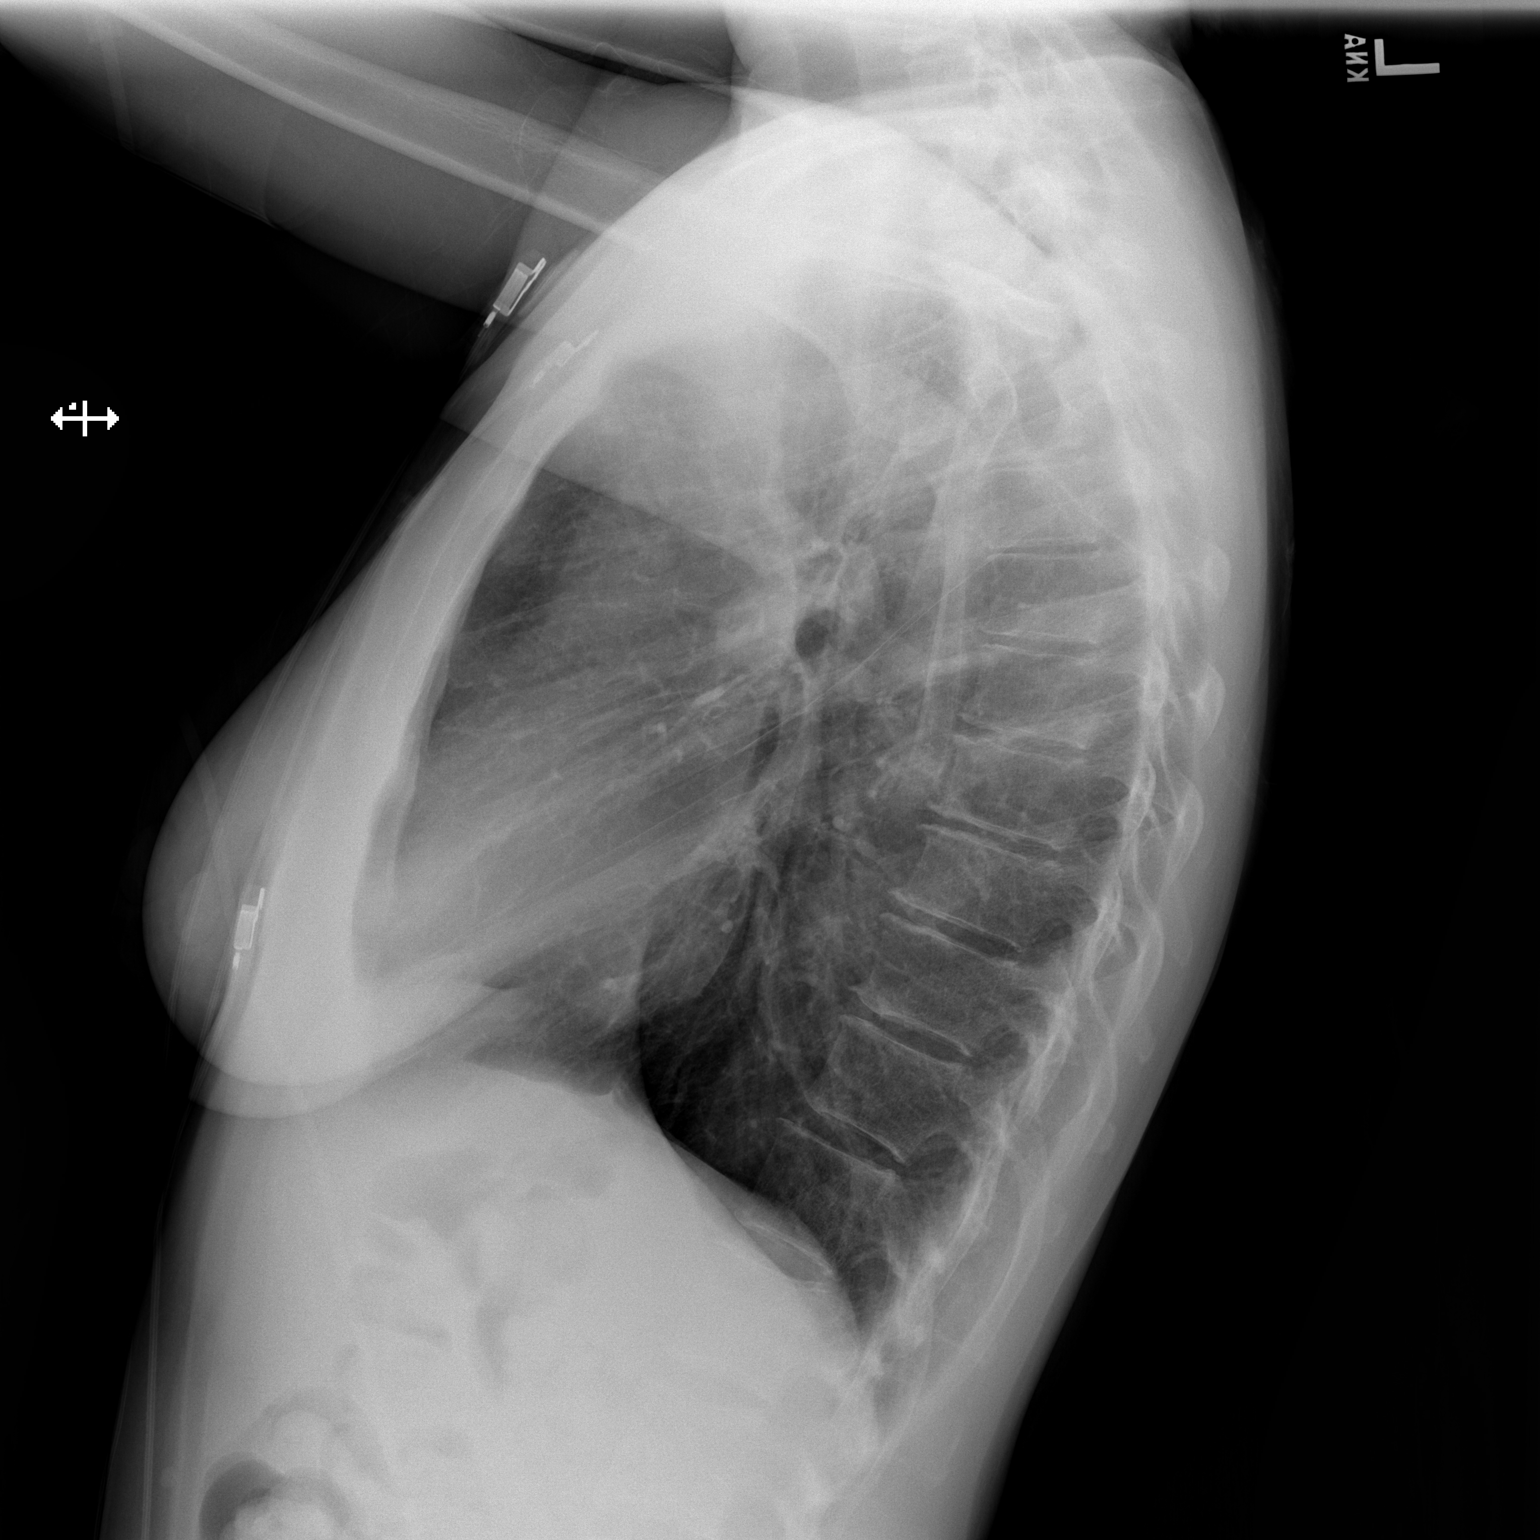

[2 of 2 positions shown; findings below may reference images not displayed]

FINDINGS: The lungs arm mildly hyperinflated. There is no focal infiltrate.
There is no pleural effusion or pneumothorax. The heart and
pulmonary vascularity are normal. The mediastinum is normal in
width. There is calcification in the wall of the aortic arch. The
bony thorax is unremarkable.
IMPRESSION: Mild hyperinflation consistent with known reactive airway disease.
No acute cardiopulmonary abnormality.

Thoracic aortic atherosclerosis.

## 2017-02-16 ENCOUNTER — Encounter: Payer: Self-pay | Admitting: Family Medicine

## 2017-02-17 ENCOUNTER — Other Ambulatory Visit: Payer: Self-pay | Admitting: Emergency Medicine

## 2017-02-17 MED ORDER — FLUOXETINE HCL 40 MG PO CAPS: 40.0000 mg | ORAL_CAPSULE | Freq: Every day | ORAL | 1 refills | Status: DC

## 2017-02-17 NOTE — Telephone Encounter (Signed)
Pt was taking 20 mg but has increased to 40 mg per provider directions. Called pt to make sure she's ok with me changing from 20 mg tablets to 40mg . Pt made aware that rx for 90 day supply of 40 mg capsules have been sent to pharmacy.

## 2017-03-07 ENCOUNTER — Encounter: Payer: Self-pay | Admitting: Family Medicine

## 2017-06-20 NOTE — Progress Notes (Addendum)
[facility] [address] [place], [place] [address] 660 [phone] [phone]  Date:  06/23/2017   Name:  Janice Myers   DOB:  03/16/1971   MRN:  [id number]  PCP:  Pearline Cablesopland, Jessica C, MD    Chief Complaint: Fatigue (c/o feeling very tired. )   History of Present Illness:  Janice Myers is a 47 y.o. very pleasant female patient who presents with the following:  Here today to discuss concerns of weight gain and fatigue Last seen by myself about one year ago: Here today to follow-up from TIA that occurred in late 2017.  She is concerned about CP Followed by [facility] Roda Shutters(Xu), because of stroke history and Cardiology Excell Seltzer([place]). Canceled elective PFO procedure.  Wanting to wait for now.   In her family, has history of Mitral Valve Prolapse.   Her uncle is a cardiologist and has been giving her advice.  He is not convinced that she has a PFO and wonders if she has MVP, and has suggested procardia as a possible good medication for her symptoms. She would like for me to run this by her cardiologist Dr. Excell Seltzerooper Has been having leg twitches at night.  Feels like a current is going through her legs.  This has been going on for about 2 months.  Denies pain, just a weird sensation. Has been having "muscle soreness" in chest (last 2 weeks), and is concerned about breakdown of muscle tissue.  States that her cardiogist and neurologist are okay with her stopping Lipitor and Plavix, and only taking a 81mg  aspirin.  Has been scared to exercise, due to her cardiac issues.  Has not exercised since December.  Denies any heavy lifting or other injury to her chest.  Chest pain described as "soreness and pressure."  Constant.  Feeling some SOB at times.  Reports that the skin on her chest is a little red.  Soreness feels like it is above the rib cage. She has not had any sweating, nausea, or jaw pain.  The CP is not worse with activity   She was caring for her  mother from 8/18- 2/19 in her home, she was living with them but is now back in [place] and her dad is caring for her Also her son had to have pretty major operations on his feet per [facility] to repair flat feet- he is doing ok, but this was not easy.  He was out of school for quite some time which was really tough on him She has 3 children Her husband is gone a lot during the week for his job They are moving to [place] for his job this summer  She has felt really tired "all the time" and like she could use a nap, and feels like she is ready to go to sleep around 8:30 pm, when she had previously been a night owl and might stay up until 11. She will take a 2 hour nap if she can, still feels tired She is getting to sleep well and quickly at night, and feels like she is sleeping well She does snore but this has not seemed to change, her husband does not comment that she is snoring more or having any stops in her breathing at night  She has gained a few lbs She is using her prozac still She is feeling not necessarily depressed right now, but she has been under a lot of pressure.  No SI  There  is a family history of thyroid disease   She still has regular menses, no change here She is not aware of any family history of early menopause   She has a therapist who she talks to on a regular basis, and does confide in her husband and brother States there is no risk of pregnancy due to lack of recent intercourse  LMP about 10 days ago   Wt Readings from Last 3 Encounters:  06/23/17 158 lb 9.6 oz (71.9 kg)  07/15/16 152 lb (68.9 kg)  07/08/16 152 lb 1.9 oz (69 kg)    Patient Active Problem List   Diagnosis Date Noted  . Dissection of vertebral artery (HCC)   . PFO (patent foramen ovale)   . TIA (transient ischemic attack) 03/06/2016  . Asthma 03/06/2016  . 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 Asthma   . Seasonal  allergies   . Stroke [facility])     Past Surgical History:  Procedure Laterality Date  . ANTERIOR CRUCIATE LIGAMENT REPAIR  2013  . KELOID EXCISION  1992  . TEE WITHOUT CARDIOVERSION N/A 03/08/2016   Procedure: TRANSESOPHAGEAL ECHOCARDIOGRAM (TEE);  Surgeon: Lars Masson, MD;  Location: [facility] ENDOSCOPY;  Service: Cardiovascular;  Laterality: N/A;    Social History   Tobacco Use  . Smoking status: Never Smoker  . Smokeless tobacco: Never Used  Substance Use Topics  . Alcohol use: Yes    Comment: Seldom  . Drug use: No    Family History  Problem Relation Age of Onset  . Stroke Mother   . Parkinson's disease Mother   . Parkinsonism Mother   . Hyperlipidemia Father   . Alzheimer's disease Maternal Grandmother   . Prostate cancer Maternal Grandfather   . Heart disease Paternal Grandfather   . Epilepsy Other        Uncle    No Known Allergies  Medication list has been reviewed and updated.  Current Outpatient Medications on File Prior to Visit  Medication Sig Dispense Refill  . albuterol (PROVENTIL HFA;VENTOLIN HFA) 108 (90 Base) MCG/ACT inhaler Inhale 1 puff into the lungs every 6 (six) hours as needed for wheezing or shortness of breath. 18 g 6  . [facility] ASPIRIN ADULT LOW DOSE 81 MG chewable tablet CHEW 1 TABLET (81 MG TOTAL) BY MOUTH DAILY. 108 tablet 0  . FLUoxetine (PROZAC) 40 MG capsule Take 1 capsule (40 mg total) by mouth daily. 90 capsule 1   No current facility-administered medications on file prior to visit.     Review of Systems:  As per HPI- otherwise negative.   Physical Examination: Vitals:   06/23/17 1038  BP: 110/72  Pulse: 69  Temp: 98 F (36.7 C)  SpO2: 98%   Vitals:   06/23/17 1038  Weight: 158 lb 9.6 oz (71.9 kg)  Height: 5\' 7"  (1.702 m)   Body mass index is 24.84 kg/m. Ideal Body Weight: Weight in (lb) to have BMI = 25: 159.3  GEN: WDWN, NAD, Non-toxic, A & O x 3, looks well, weight normal HEENT: Atraumatic, Normocephalic. Neck  supple. No masses, No LAD.  Bilateral TM wnl, oropharynx normal.  PEERL,EOMI.   Ears and Nose: No external deformity. CV: RRR, No M/G/R. No JVD. No thrill. No extra heart sounds. PULM: CTA B, no wheezes, crackles, rhonchi. No retractions. No resp. distress. No accessory muscle use. ABD: S, NT, ND, +BS. No rebound. No HSM. EXTR:  No c/c/e NEURO Normal gait.  PSYCH: Normally interactive. Conversant. Not depressed or anxious appearing.  Calm demeanor.    Assessment and Plan: Other fatigue - Plan: CBC, Comprehensive metabolic panel, TSH, Ferritin  Screening for hyperlipidemia - Plan: Lipid panel  Screening for diabetes mellitus - Plan: Hemoglobin A1c  Here today with excessive fatigue and having gained a few lbs Labs pending as above- Will plan further follow- up pending labs. If all normal consider sleep study to rule out OSA  Signed Abbe Amsterdam, MD  Received her labs - iron is low although she is not anemic Message to pt  Your labs look good except your iron (ferritin) is low. You are not anemic however, so I am not convinced that this is causing your symptoms.   Thyroid is normal Cholesterol looks great Metabolic profile is normal A1c (average blood sugar) is normal  Your sodium is minimally low but I would not expect this to cause symptoms I am going to send in an iron supplement for you to take; please come in for a repeat ferritin and sodium level (labs only) in about 2 months.   As we do not have a definite cause of your symptoms as of yet, we might want to go ahead and order a sleep study to rule out sleep apnea.  What do you think? Results for orders placed or performed in visit on 06/23/17  CBC  Result Value Ref Range   WBC 5.1 4.0 - 10.5 K/uL   RBC 4.37 3.87 - 5.11 Mil/uL   Platelets 234.0 150.0 - 400.0 K/uL   Hemoglobin 13.5 12.0 - 15.0 g/dL   HCT 16.1 09.6 - 04.5 %   MCV 92.1 78.0 - 100.0 fl   MCHC 33.6 30.0 - 36.0 g/dL   RDW 40.9 81.1 - 91.4 %   Comprehensive metabolic panel  Result Value Ref Range   Sodium 133 (L) 135 - 145 mEq/L   Potassium 4.5 3.5 - 5.1 mEq/L   Chloride 98 96 - 112 mEq/L   CO2 29 19 - 32 mEq/L   Glucose, Bld 83 70 - 99 mg/dL   BUN 12 6 - 23 mg/dL   Creatinine, Ser 7.82 0.40 - 1.20 mg/dL   Total Bilirubin 0.6 0.2 - 1.2 mg/dL   Alkaline Phosphatase 34 (L) 39 - 117 U/L   AST 17 0 - 37 U/L   ALT 12 0 - 35 U/L   Total Protein 7.2 6.0 - 8.3 g/dL   Albumin 4.3 3.5 - 5.2 g/dL   Calcium 9.4 8.4 - 95.6 mg/dL   GFR 21.30 >86.57 mL/min  TSH  Result Value Ref Range   TSH 1.68 0.35 - 4.50 uIU/mL  Ferritin  Result Value Ref Range   Ferritin 5.7 (L) 10.0 - 291.0 ng/mL  Hemoglobin A1c  Result Value Ref Range   Hgb A1c MFr Bld 5.4 4.6 - 6.5 %  Lipid panel  Result Value Ref Range   Cholesterol 190 0 - 200 mg/dL   Triglycerides 84.6 0.0 - 149.0 mg/dL   HDL 96.29 >52.84 mg/dL   VLDL 13.2 0.0 - 44.0 mg/dL   LDL Cholesterol 84 0 - 99 mg/dL   Total CHOL/HDL Ratio 2    NonHDL 101.59

## 2017-06-23 ENCOUNTER — Encounter: Payer: Self-pay | Admitting: Family Medicine

## 2017-06-23 ENCOUNTER — Ambulatory Visit (INDEPENDENT_AMBULATORY_CARE_PROVIDER_SITE_OTHER): Payer: 59 | Admitting: Family Medicine

## 2017-06-23 VITALS — BP 110/72 | HR 69 | Temp 98.0°F | Ht 67.0 in | Wt 158.6 lb

## 2017-06-23 DIAGNOSIS — R5383 Other fatigue: Secondary | ICD-10-CM

## 2017-06-23 DIAGNOSIS — Z13220 Encounter for screening for lipoid disorders: Secondary | ICD-10-CM | POA: Diagnosis not present

## 2017-06-23 DIAGNOSIS — Z131 Encounter for screening for diabetes mellitus: Secondary | ICD-10-CM | POA: Diagnosis not present

## 2017-06-23 DIAGNOSIS — E871 Hypo-osmolality and hyponatremia: Secondary | ICD-10-CM | POA: Diagnosis not present

## 2017-06-23 DIAGNOSIS — R790 Abnormal level of blood mineral: Secondary | ICD-10-CM

## 2017-06-23 LAB — COMPREHENSIVE METABOLIC PANEL
ALBUMIN: 4.3 g/dL (ref 3.5–5.2)
ALK PHOS: 34 U/L — AB (ref 39–117)
ALT: 12 U/L (ref 0–35)
AST: 17 U/L (ref 0–37)
BILIRUBIN TOTAL: 0.6 mg/dL (ref 0.2–1.2)
BUN: 12 mg/dL (ref 6–23)
CALCIUM: 9.4 mg/dL (ref 8.4–10.5)
CO2: 29 mEq/L (ref 19–32)
CREATININE: 0.84 mg/dL (ref 0.40–1.20)
Chloride: 98 mEq/L (ref 96–112)
GFR: 77.93 mL/min (ref >60.00)
Glucose, Bld: 83 mg/dL (ref 70–99)
Potassium: 4.5 mEq/L (ref 3.5–5.1)
Sodium: 133 mEq/L — ABNORMAL LOW (ref 135–145)
TOTAL PROTEIN: 7.2 g/dL (ref 6.0–8.3)

## 2017-06-23 LAB — LIPID PANEL
CHOL/HDL RATIO: 2
Cholesterol: 190 mg/dL (ref 0–200)
HDL: 88.3 mg/dL (ref >39.00)
LDL Cholesterol: 84 mg/dL (ref 0–99)
NONHDL: 101.59
Triglycerides: 89 mg/dL (ref 0.0–149.0)
VLDL: 17.8 mg/dL (ref 0.0–40.0)

## 2017-06-23 LAB — CBC
HEMATOCRIT: 40.2 % (ref 36.0–46.0)
HEMOGLOBIN: 13.5 g/dL (ref 12.0–15.0)
MCHC: 33.6 g/dL (ref 30.0–36.0)
MCV: 92.1 fl (ref 78.0–100.0)
PLATELETS: 234 10*3/uL (ref 150.0–400.0)
RBC: 4.37 Mil/uL (ref 3.87–5.11)
RDW: 13.3 % (ref 11.5–15.5)
WBC: 5.1 10*3/uL (ref 4.0–10.5)

## 2017-06-23 LAB — HEMOGLOBIN A1C: HEMOGLOBIN A1C: 5.4 % (ref 4.6–6.5)

## 2017-06-23 LAB — FERRITIN: FERRITIN: 5.7 ng/mL — AB (ref 10.0–291.0)

## 2017-06-23 LAB — TSH: TSH: 1.68 u[IU]/mL (ref 0.35–4.50)

## 2017-06-23 MED ORDER — FERROUS SULFATE 325 (65 FE) MG PO TABS
325.0000 mg | ORAL_TABLET | Freq: Every day | ORAL | 2 refills | Status: DC
Start: 2017-06-23 — End: 2017-09-29

## 2017-06-23 NOTE — Patient Instructions (Signed)
It was very nice to see you again today- I am sorry that life has been so hectic recently!  I am going to look for any cause of fatigue on your labs today.  If all is ok we will likely want to consider a sleep test to rule- out sleep apnea  Let me know if any changes or other concerns and I will be in touch with your labs asap

## 2017-06-23 NOTE — Addendum Note (Signed)
Addended by: Abbe AmsterdamOPLAND, Jacub Waiters C on: 06/23/2017 05:19 PM   Modules accepted: Orders

## 2017-07-15 ENCOUNTER — Encounter: Payer: Self-pay | Admitting: Family Medicine

## 2017-07-15 ENCOUNTER — Other Ambulatory Visit: Payer: 59

## 2017-08-13 NOTE — Telephone Encounter (Signed)
Relation to pt: self Call back number: [phone] (M)   Reason for call:  Patient would like to schedule lab appointment, chart doesn't reflect orders, please advise

## 2017-08-16 ENCOUNTER — Encounter: Payer: Self-pay | Admitting: Family Medicine

## 2017-08-16 DIAGNOSIS — E871 Hypo-osmolality and hyponatremia: Secondary | ICD-10-CM

## 2017-08-16 DIAGNOSIS — E611 Iron deficiency: Secondary | ICD-10-CM

## 2017-08-19 NOTE — Telephone Encounter (Signed)
Please advise. No future orders have been placed.

## 2017-08-20 ENCOUNTER — Other Ambulatory Visit (INDEPENDENT_AMBULATORY_CARE_PROVIDER_SITE_OTHER): Payer: 59

## 2017-08-20 ENCOUNTER — Encounter: Payer: Self-pay | Admitting: Family Medicine

## 2017-08-20 DIAGNOSIS — E871 Hypo-osmolality and hyponatremia: Secondary | ICD-10-CM | POA: Diagnosis not present

## 2017-08-20 DIAGNOSIS — E611 Iron deficiency: Secondary | ICD-10-CM | POA: Diagnosis not present

## 2017-08-20 LAB — FERRITIN: FERRITIN: 18.6 ng/mL (ref 10.0–291.0)

## 2017-08-20 LAB — BASIC METABOLIC PANEL
BUN: 11 mg/dL (ref 6–23)
CHLORIDE: 103 meq/L (ref 96–112)
CO2: 27 mEq/L (ref 19–32)
CREATININE: 0.8 mg/dL (ref 0.40–1.20)
Calcium: 9.5 mg/dL (ref 8.4–10.5)
GFR: 82.38 mL/min (ref >60.00)
Glucose, Bld: 88 mg/dL (ref 70–99)
POTASSIUM: 4.3 meq/L (ref 3.5–5.1)
Sodium: 138 mEq/L (ref 135–145)

## 2017-09-26 ENCOUNTER — Encounter: Payer: Self-pay | Admitting: Family Medicine

## 2017-09-26 DIAGNOSIS — R790 Abnormal level of blood mineral: Secondary | ICD-10-CM

## 2017-09-29 MED ORDER — FERROUS SULFATE 325 (65 FE) MG PO TABS: 325.0000 mg | ORAL_TABLET | Freq: Every day | ORAL | 1 refills | Status: DC

## 2017-09-29 MED ORDER — FLUOXETINE HCL 40 MG PO CAPS: 40.0000 mg | ORAL_CAPSULE | Freq: Every day | ORAL | 1 refills | Status: DC

## 2017-11-10 ENCOUNTER — Encounter: Payer: Self-pay | Admitting: Family Medicine

## 2017-11-11 MED ORDER — ALBUTEROL SULFATE HFA 108 (90 BASE) MCG/ACT IN AERS: 1.0000 | INHALATION_SPRAY | Freq: Four times a day (QID) | RESPIRATORY_TRACT | 6 refills | Status: AC | PRN

## 2017-11-11 MED ORDER — FLUOXETINE HCL 40 MG PO CAPS: 40.0000 mg | ORAL_CAPSULE | Freq: Every day | ORAL | 1 refills | Status: AC

## 2017-11-11 NOTE — Addendum Note (Signed)
Addended by: Steve RattlerBLEVINS, BAILEY A on: 11/11/2017 09:36 AM   Modules accepted: Orders

## 2018-03-24 ENCOUNTER — Other Ambulatory Visit: Payer: Self-pay | Admitting: Family Medicine

## 2018-03-24 DIAGNOSIS — R790 Abnormal level of blood mineral: Secondary | ICD-10-CM
